# Patient Record
Sex: Female | Born: 1935
Health system: Southern US, Community
[De-identification: ages and names within clinical notes are randomized; demographics above are authoritative.]

## PROBLEM LIST (undated history)

## (undated) DIAGNOSIS — H548 Legal blindness, as defined in USA: Secondary | ICD-10-CM

## (undated) DIAGNOSIS — E039 Hypothyroidism, unspecified: Secondary | ICD-10-CM

## (undated) DIAGNOSIS — I82409 Acute embolism and thrombosis of unspecified deep veins of unspecified lower extremity: Secondary | ICD-10-CM

## (undated) DIAGNOSIS — H409 Unspecified glaucoma: Secondary | ICD-10-CM

## (undated) DIAGNOSIS — I10 Essential (primary) hypertension: Secondary | ICD-10-CM

## (undated) HISTORY — DX: Essential (primary) hypertension: I10

## (undated) HISTORY — PX: SPINE SURGERY: SHX786

## (undated) HISTORY — PX: NASAL SINUS SURGERY: SHX719

## (undated) HISTORY — DX: Unspecified glaucoma: H40.9

---

## 2001-12-31 ENCOUNTER — Ambulatory Visit (HOSPITAL_COMMUNITY): Admission: RE | Admit: 2001-12-31 | Discharge: 2001-12-31 | Payer: Self-pay | Admitting: Internal Medicine

## 2001-12-31 ENCOUNTER — Encounter: Payer: Self-pay | Admitting: Internal Medicine

## 2003-01-02 ENCOUNTER — Encounter: Payer: Self-pay | Admitting: Internal Medicine

## 2003-01-02 ENCOUNTER — Ambulatory Visit (HOSPITAL_COMMUNITY): Admission: RE | Admit: 2003-01-02 | Discharge: 2003-01-02 | Payer: Self-pay | Admitting: Internal Medicine

## 2003-05-18 ENCOUNTER — Ambulatory Visit (HOSPITAL_COMMUNITY): Admission: RE | Admit: 2003-05-18 | Discharge: 2003-05-18 | Payer: Self-pay | Admitting: Internal Medicine

## 2004-01-09 ENCOUNTER — Ambulatory Visit (HOSPITAL_COMMUNITY): Admission: RE | Admit: 2004-01-09 | Discharge: 2004-01-09 | Payer: Self-pay | Admitting: Internal Medicine

## 2005-05-05 ENCOUNTER — Ambulatory Visit (HOSPITAL_COMMUNITY): Admission: RE | Admit: 2005-05-05 | Discharge: 2005-05-05 | Payer: Self-pay | Admitting: Internal Medicine

## 2006-05-11 ENCOUNTER — Ambulatory Visit (HOSPITAL_COMMUNITY): Admission: RE | Admit: 2006-05-11 | Discharge: 2006-05-11 | Payer: Self-pay | Admitting: Internal Medicine

## 2006-06-05 ENCOUNTER — Ambulatory Visit (HOSPITAL_COMMUNITY): Admission: RE | Admit: 2006-06-05 | Discharge: 2006-06-05 | Payer: Self-pay | Admitting: Internal Medicine

## 2007-05-17 ENCOUNTER — Ambulatory Visit (HOSPITAL_COMMUNITY): Admission: RE | Admit: 2007-05-17 | Discharge: 2007-05-17 | Payer: Self-pay | Admitting: Internal Medicine

## 2008-01-25 ENCOUNTER — Ambulatory Visit (HOSPITAL_COMMUNITY): Admission: RE | Admit: 2008-01-25 | Discharge: 2008-01-25 | Payer: Self-pay | Admitting: Internal Medicine

## 2008-02-29 ENCOUNTER — Ambulatory Visit (HOSPITAL_COMMUNITY): Admission: RE | Admit: 2008-02-29 | Discharge: 2008-02-29 | Payer: Self-pay | Admitting: Ophthalmology

## 2008-05-18 ENCOUNTER — Ambulatory Visit (HOSPITAL_COMMUNITY): Admission: RE | Admit: 2008-05-18 | Discharge: 2008-05-18 | Payer: Self-pay | Admitting: Internal Medicine

## 2008-05-19 ENCOUNTER — Ambulatory Visit: Payer: Self-pay | Admitting: Internal Medicine

## 2008-06-08 ENCOUNTER — Ambulatory Visit: Payer: Self-pay | Admitting: Internal Medicine

## 2008-06-08 ENCOUNTER — Ambulatory Visit (HOSPITAL_COMMUNITY): Admission: RE | Admit: 2008-06-08 | Discharge: 2008-06-08 | Payer: Self-pay | Admitting: Internal Medicine

## 2008-06-08 ENCOUNTER — Encounter: Payer: Self-pay | Admitting: Internal Medicine

## 2008-07-18 ENCOUNTER — Ambulatory Visit (HOSPITAL_COMMUNITY): Admission: RE | Admit: 2008-07-18 | Discharge: 2008-07-18 | Payer: Self-pay | Admitting: Ophthalmology

## 2009-05-21 ENCOUNTER — Ambulatory Visit (HOSPITAL_COMMUNITY): Admission: RE | Admit: 2009-05-21 | Discharge: 2009-05-21 | Payer: Self-pay | Admitting: Internal Medicine

## 2010-05-27 ENCOUNTER — Ambulatory Visit (HOSPITAL_COMMUNITY): Admission: RE | Admit: 2010-05-27 | Discharge: 2010-05-27 | Payer: Self-pay | Admitting: Internal Medicine

## 2011-02-18 NOTE — Op Note (Signed)
NAME:  DYLANN, LAYNE               ACCOUNT NO.:  [id number]   MEDICAL RECORD NO.:  [id number]          PATIENT TYPE:  AMB   LOCATION:  DAY                           FACILITY:  [facility]   PHYSICIAN:  R. Roetta Sessions, M.D. DATE OF BIRTH:  [birth year]   DATE OF PROCEDURE:  06/08/2008  DATE OF DISCHARGE:                               OPERATIVE REPORT   Colonoscopy, snare polypectomy, polyp ablation.   INDICATIONS FOR PROCEDURE:  An 77 year old lady with a history of  colonic adenomas, last colonoscopy is 2004, and she has found have only  benign polypoid mucosa.  She has no lower GI tract symptoms.  Colonoscopy is now being done as a surveillance maneuver.  Risks,  benefits, alternatives, limitations have been reviewed, questions  answered.  Please see documentation of medical record.   PROCEDURE NOTE:  O2 saturation, blood pressure, pulse, respirations  monitored throughout the entire procedure.   CONSCIOUS SEDATION:  Versed 4 mg IV, Demerol 75 mg IV in divided doses.   INSTRUMENT:  Pentax video chip system.   FINDINGS:  Digital rectal exam revealed no abnormalities.  Endoscopic  findings, the prep was good.  Colon:  Colonic mucosa was surveyed from  the rectosigmoid junction through the left transverse, right colon,  appendiceal orifice, ileocecal valve, and cecum.  These structures were  well seen and photographed for the record. From this level, scope was  slowly cautiously withdrawn.  All previously mentioned mucosal surfaces  were again seen.  The patient was noted to have a 5-mm polyp in the  base.  The cecum which was cold snared were recovered through the scope.  Adjacent to this area, there were two other areas of adenomatous-  appearing polypoid tissue, one was about 2-mm in dimension, one was  about 1 x 4 mm in dimensions on a fold.  These areas were ablated with  tip of the snare with hot cautery.  Just distal to that the ileocecal  valve by about 10-cm was a 6-mm flat  adenoma on a fold, it was removed  with hot snare cautery, recovered through the scope.  The rectal colonic  mucosa was closely inspected.  The patient has scattered left-sided  diverticula.  The remainder of colonic mucosa appeared normal.  Scope  was pulled down the rectum.  A thorough examination of rectal mucosa  including retroflex view of the anal verge revealed no abnormalities.  The patient tolerated the procedure well as reactive  Endoscopy.   IMPRESSION:  1. Multiple cecal polyps ablated and/or snare as described above.  2. Proximal ascending polyp (10-cm distal ileocecal valve) status post      hot snare polypectomy.  3. Left-sided diverticula.  Colonic mucosa appeared unremarkable,      normal rectum.   RECOMMENDATIONS:  1. A diverticulosis polyp literature provided Ms. Sunday.  2. Hold aspirin for 5 days and resume.  3. Followup on path.  4. Further recommendations to follow.      Jonathon Bellows, M.D.  Electronically Signed     RMR/MEDQ  D:  06/08/2008  T:  06/08/2008  Job:  [id number]   cc:   Kingsley Callander. Ouida Sills, MD  Fax: [phone]

## 2011-02-18 NOTE — H&P (Signed)
NAME:  Nancy [name], Nancy [name]               ACCOUNT NO.:  [id number]   MEDICAL RECORD NO.:  [id number]          PATIENT TYPE:  AMB   LOCATION:  DAY                           FACILITY:  [facility]   PHYSICIAN:  R. Roetta Sessions, M.D. DATE OF BIRTH:  04/09/1925   DATE OF ADMISSION:  DATE OF DISCHARGE:  LH                              HISTORY & PHYSICAL   CHIEF COMPLAINT:  Time for colonoscopy.   HISTORY OF PRESENT ILLNESS:  Nancy [name] is an 77 year old Caucasian  female with a history of adenomatous polyps, who presented today to  schedule surveillance colonoscopy.  She has been doing very well since  her last followup.  She has had some issues with her eyes.  She has had  a cataract extraction on the right side and diagnosed with glaucoma.  She has had chronic intermittent hematochezia when she passes a hard  stool.  She states this is almost every time she has a bowel movement  actually.  Her bowel movements are irregular, currently, anywhere from  daily to every 2-3 days.  She does not use any medication.  She does not  have any bowel regimen per se.  She denies any abdominal pain, nausea or  vomiting, heart burn, dysphagia, odynophagia, or melena.   CURRENT MEDICATIONS:  1. Synthroid 50 mcg daily.  2. Evista 60 mg daily.  3. Lipitor 40 mg daily.  4. Calcium with vitamin D 600 mg daily.  5. Aspirin 81 mg daily.  6. Theratears daily.  7. Restasis eye drops b.i.d.  8. PreserVision 2 daily.  9. Travatan eye drops daily.  10.Systane eye drops daily.   ALLERGIES:  No known drug allergies.   PAST MEDICAL HISTORY:  1. Hyperlipidemia.  2. Hypothyroidism.  3. Glaucoma.  4. She had 2 back surgeries for ruptured disk.  5. Right cataract extraction recently.   FAMILY HISTORY:  Mother died of breast cancer at age 12.  Father died of  myocardial infarction at age 51.  She had 3 sisters with breast cancer,  one has deceased.  No family history of colorectal cancer.   SOCIAL HISTORY:  She is  widowed.  She has 4 children.  She is retired  from [employer].  She has never been a smoker.  No alcohol use.   REVIEW OF SYSTEMS:  GI:  See HPI.  CONSTITUTIONAL:  No intentional  weight loss.  CARDIOPULMONARY:  No chest pain or shortness of breath.  GENITOURINARY:  No dysuria or hematuria.   PHYSICAL EXAMINATION:  VITAL SIGNS:  Weight 145, height 5 feet 7 inches,  temperature 98.5, blood pressure 128/80, pulse 84.  GENERAL:  A pleasant, well-nourished, well-developed Caucasian female,  in no acute distress.  SKIN:  Warm and dry.  No jaundice.  HEENT:  Sclera nonicteric.  Oropharyngeal mucosa moist and pink.  No  lesions, erythema, or exudate.  CHEST:  Lungs are clear to auscultation.  CARDIAC:  Regular rate and rhythm.  No murmurs, rubs, or gallops.  ABDOMEN:  Positive bowel sounds.  Abdomen is soft, nontender, and  nondistended.  No organomegaly or masses.  No rebound or guarding.  No  abdominal bruits or hernia.  LOWER EXTREMITIES:  No edema.   IMPRESSION:  The patient is a pleasant 77 year old lady with history of  adenomatous colonic polyps who is due for surveillance colonoscopy.  She  has chronic intermittent hematochezia most likely due to benign  anorectal source.   PLAN:  1. Colonoscopy with Dr. Jena Gauss in the near future.  2. Home aspirin 4 days prior to procedure.  3. Recommended MiraLax 17 g daily as needed for constipation.      Tana Coast, P.AJonathon Bellows, M.D.  Electronically Signed    LL/MEDQ  D:  05/19/2008  T:  05/20/2008  Job:  [id number]

## 2011-02-21 NOTE — H&P (Signed)
NAME:  Nancy [name], Nancy [name]                           ACCOUNT NO.:  [id number]   MEDICAL RECORD NO.:  [id number]                  PATIENT TYPE:   LOCATION:                                       FACILITY:   PHYSICIAN:  R. Roetta Sessions, M.D.              DATE OF BIRTH:  [birth year]   DATE OF ADMISSION:  DATE OF DISCHARGE:                                HISTORY & PHYSICAL   REASON FOR VISIT:  Problems swallowing, time for colonoscopy.   HISTORY OF PRESENT ILLNESS:  The patient is a 77 year old Caucasian female,  a patient of Dr. Ouida Sills, who presents today for further evaluation of the  above stated symptoms.  She has a history of adenomatous polyps.  Her last  colonoscopy July 2001 at which time she had a left-sided diverticula and a  small, sessile polyp at the appendiceal orifices, which was a tubular  adenoma.  She, over the last several weeks, has developed acid reflux-type  symptoms.  She notices it particularly when she is supine.  She feels like  there is pressure in her esophagus.  She has had excessive belching and some  epigastric/right upper quadrant abdominal pain which occurs intermittently.  She also has the sensation of difficulty swallowing, particularly solid  foods.  She saw Dr. Ouida Sills who placed on Aciphex about a week ago.  She also  decreased her orange juice consumption.  This has helped some but she has  not noted complete resolution of her symptoms.  She has a history of chronic  constipation.  No melena or rectal bleeding.   CURRENT MEDICATIONS:  1. Synthroid 50 mcg every day.  2. Aciphex 20 mg every day.  3. Evista 60 mg every day.  4. Lipitor 10 mg every day.  5. Calcium plus D 600 mg every day.  6. Vitamin E 400 international units every day.  7. Multivitamin every day.  8. Aspirin 81 mg every day.   ALLERGIES:  No known drug allergies.   PAST MEDICAL HISTORY:  1. Hypercholesterolemia.  2. Hypothyroidism.  3. She has had two back surgeries for ruptured  disk.   FAMILY HISTORY:  Mother died of breast cancer at age 92.  Father died of MI  at age 72.  She has had three sisters with breast cancer, one is deceased.  No family history of colorectal cancer to her knowledge.   SOCIAL HISTORY:  She is married for 52 years.  She has four children.  She  is retired from [employer].  She has never been a smoker.  Denies  any alcohol use.   REVIEW OF SYSTEMS:  Please see HPI for GI.  GENERAL:  Denies any weight  loss.  CARDIOPULMONARY:  Denies any chest pain or shortness of breath.   PHYSICAL EXAMINATION:  VITAL SIGNS: Weight 149, blood pressure 124/74, pulse  76.  GENERAL:  A pleasant, well nourished, well developed Caucasian  female in no  acute distress.  SKIN:  Warm and dry.  No jaundice.  HEENT:  Conjunctivae are pink.  Sclerae are nonicteric.  Oropharyngeal  mucosa is moist and pink.  No lesions, erythema or exudate.  No  lymphadenopathy, thyromegaly.  CHEST:  Lungs are clear to auscultation.  CARDIAC:  Reveals a regular, rate and rhythm, normal S1, S2.  No murmurs,  rubs or gallops.  ABDOMEN:  Positive bowel sounds.  Soft, nontender, nondistended.  No  organomegaly or masses.  EXTREMITIES:  No edema.   IMPRESSION:  1. The patient is a pleasant 77 year old lady with a history of adenomatous     colonic polyps who was due for surveillance colonoscopy at this time.  2. In addition, she has developed a several week history of reflux type     symptoms as well as difficulty swallowing solid food.  3. Patient's has had intermittent epigastric/right upper quadrant abdominal     pain which is improving on Aciphex.   PLAN:  1. Colonoscopy with EGD and possible esophageal dilatation in the near     future.  2. I discussed risks, alternatives, and benefits of these procedures with     the patient and she is agreeable to proceed.  3. She will continue Aciphex 20 mg daily for now.   I would like to thank Dr. Ouida Sills for allowing [place] to  take part in the care of  this patient.     Tana Coast, Pricilla Larsson, M.D.    LL/MEDQ  D:  05/02/2003  T:  05/02/2003  Job:  [id number]   cc:   Kingsley Callander. Ouida Sills, M.D.  [address]  [place]  [place] [address]  Fax: [phone]   R. Roetta Sessions, M.D.  [address]  [place]  [place] [address]  Fax: [phone]

## 2011-02-21 NOTE — Op Note (Signed)
NAME:  Nancy Nancy [name], Nancy Nancy [name]                         ACCOUNT NO.:  [id number]   MEDICAL RECORD NO.:  [id number]                   PATIENT TYPE:  AMB   LOCATION:  DAY                                  FACILITY:  [facility]   PHYSICIAN:  R. Roetta Sessions, M.D.              DATE OF BIRTH:  Sep 07, 1925   DATE OF PROCEDURE:  05/18/2003  DATE OF DISCHARGE:                                 OPERATIVE REPORT   PROCEDURES:  Esophagogastroduodenoscopy and colonoscopy with biopsy.   INDICATION FOR PROCEDURE:  The patient is Nancy [name] 77 year old lady with Nancy [name] history  of colonic adenomatous polyps.  She is due for surveillance at this time.  Also she has had Nancy [name] several-week history of reflux-type symptoms with some  difficulty swallowing.  It is notable she was started on Aciphex by Dr.  Ouida Sills recently.  This has been associated with Nancy [name] marked improvement in her  reflux symptoms and resolution of her dysphagia symptoms.  EGD and  colonoscopy are now being done.  This approach has been discussed with the  patient, the potential risks, benefits, and alternatives have been reviewed,  questions answered, she is agreeable.  Please see my dictated H&P from May 02, 2003, for more information.   PROCEDURE NOTE:  O2 saturation, blood pressure, pulse, and respirations were  monitored throughout the entire procedure.   Conscious sedation:  Versed 3 mg IV, Demerol 75 mg IV, in divided doses.   Instrument:  Olympus video chip adult gastroscope and colonoscope.   FINDINGS:  Esophagogastroduodenoscopy:  Examination of the tubular esophagus revealed  no mucosal abnormalities.  It was widely patent, the EG junction easily  traversed.   Stomach:  The gastric cavity was empty, insufflated well with air.  Nancy [name]  thorough examination of the gastric mucosa, including retroflexed view of  the proximal stomach and esophagogastric junction, demonstrated Nancy [name] small  hiatal hernia.  The remainder of the gastric mucosa appeared normal.  Pylorus  patent and easily traversed.   Duodenum:  The bulb and second portion appeared normal.   Therapeutic/diagnostic maneuvers performed:  None.   The patient tolerated the procedure well, was prepared for colonoscopy.  Digital rectal examination revealed no endoscopy.   Endoscopic findings:  Prep was good.   Rectum:  Examination of the rectal mucosa including retroflexed view of the  anal verge revealed no abnormalities.   Colon:  The colonic mucosa was surveyed from the rectosigmoid junction  through the left, transverse, and right colon, to the area of the  appendiceal orifice, ileocecal valve, and cecum.  These structures were well-  seen and photographed for the record.  The patient had Nancy [name] tortuous left colon  with numerous left-sided diverticula.  There were two 3 mm polyps in the  cecum, which were cold biopsied/removed.  The remainder of the colonic  mucosa appeared normal.  From this level the scope was withdrawn.  All  previously-mentioned mucosal  surfaces were again seen and, again, no other  abnormalities were observed.  The patient tolerated both procedures well,  was reactive in endoscopy.   IMPRESSION:  EGD:  1. Normal esophagus.  2. Small hiatal hernia.  3. Remainder of the stomach and duodenum through the second portion appeared     normal;   Colonoscopy findings:  1. Normal rectum.  2. Tortuous left colon with numerous left-sided diverticula.  3. Diminutive polyps in the cecum cold biopsied/removed.  4. Remainder of the colonic mucosa appeared normal.   RECOMMENDATIONS:  1. Diverticulosis literature provided to Nancy Nancy [name].  2. Continue Aciphex 20 mg orally daily (which seems to be helping     tremendously).  3. Follow up on pathology.  4. Further recommendations to follow.                                                 Jonathon Bellows, M.D.    RMR/MEDQ  D:  05/18/2003  T:  05/18/2003  Job:  [id number]   cc:   Kingsley Callander. Ouida Sills, M.D.  [address]  [place]  [place] [address]  Fax: [phone]

## 2011-04-23 ENCOUNTER — Other Ambulatory Visit (HOSPITAL_COMMUNITY): Payer: Self-pay | Admitting: Internal Medicine

## 2011-04-23 DIAGNOSIS — Z139 Encounter for screening, unspecified: Secondary | ICD-10-CM

## 2011-06-02 ENCOUNTER — Ambulatory Visit (HOSPITAL_COMMUNITY)
Admission: RE | Admit: 2011-06-02 | Discharge: 2011-06-02 | Disposition: A | Discharge status: Home / Self Care | Payer: Medicare Other | Source: Ambulatory Visit | Attending: Internal Medicine | Admitting: Internal Medicine

## 2011-06-02 DIAGNOSIS — Z139 Encounter for screening, unspecified: Secondary | ICD-10-CM

## 2011-06-02 DIAGNOSIS — Z1231 Encounter for screening mammogram for malignant neoplasm of breast: Secondary | ICD-10-CM | POA: Insufficient documentation

## 2011-07-02 LAB — HEMOGLOBIN AND HEMATOCRIT, BLOOD
HCT: 39.8
Hemoglobin: 14.3

## 2011-07-02 LAB — BASIC METABOLIC PANEL
Chloride: 103
GFR calc Af Amer: >60
Potassium: 3.8

## 2011-07-07 DIAGNOSIS — H401133 Primary open-angle glaucoma, bilateral, severe stage: Secondary | ICD-10-CM | POA: Insufficient documentation

## 2011-09-16 DIAGNOSIS — H348192 Central retinal vein occlusion, unspecified eye, stable: Secondary | ICD-10-CM | POA: Insufficient documentation

## 2011-11-17 ENCOUNTER — Other Ambulatory Visit (HOSPITAL_COMMUNITY): Payer: Self-pay | Admitting: Internal Medicine

## 2011-11-17 ENCOUNTER — Ambulatory Visit (HOSPITAL_COMMUNITY)
Admission: RE | Admit: 2011-11-17 | Discharge: 2011-11-17 | Disposition: A | Discharge status: Home / Self Care | Payer: Medicare Other | Source: Ambulatory Visit | Attending: Internal Medicine | Admitting: Internal Medicine

## 2011-11-17 DIAGNOSIS — M25519 Pain in unspecified shoulder: Secondary | ICD-10-CM | POA: Insufficient documentation

## 2011-11-17 DIAGNOSIS — M25512 Pain in left shoulder: Secondary | ICD-10-CM

## 2012-04-20 ENCOUNTER — Other Ambulatory Visit: Payer: Self-pay | Admitting: Otolaryngology

## 2012-06-22 ENCOUNTER — Other Ambulatory Visit (HOSPITAL_COMMUNITY): Payer: Self-pay | Admitting: Internal Medicine

## 2012-06-22 DIAGNOSIS — Z139 Encounter for screening, unspecified: Secondary | ICD-10-CM

## 2012-06-28 ENCOUNTER — Ambulatory Visit (HOSPITAL_COMMUNITY): Payer: Medicare Other

## 2012-07-05 ENCOUNTER — Ambulatory Visit (HOSPITAL_COMMUNITY)
Admission: RE | Admit: 2012-07-05 | Discharge: 2012-07-05 | Disposition: A | Discharge status: Home / Self Care | Payer: Medicare Other | Source: Ambulatory Visit | Attending: Internal Medicine | Admitting: Internal Medicine

## 2012-07-05 DIAGNOSIS — Z139 Encounter for screening, unspecified: Secondary | ICD-10-CM

## 2012-07-05 DIAGNOSIS — Z1231 Encounter for screening mammogram for malignant neoplasm of breast: Secondary | ICD-10-CM | POA: Insufficient documentation

## 2013-05-16 ENCOUNTER — Encounter: Payer: Self-pay | Admitting: Internal Medicine

## 2013-06-09 ENCOUNTER — Other Ambulatory Visit (HOSPITAL_COMMUNITY): Payer: Self-pay | Admitting: Internal Medicine

## 2013-06-09 DIAGNOSIS — Z139 Encounter for screening, unspecified: Secondary | ICD-10-CM

## 2013-07-07 ENCOUNTER — Ambulatory Visit (HOSPITAL_COMMUNITY)
Admission: RE | Admit: 2013-07-07 | Discharge: 2013-07-07 | Disposition: A | Discharge status: Home / Self Care | Payer: Medicare Other | Source: Ambulatory Visit | Attending: Internal Medicine | Admitting: Internal Medicine

## 2013-07-07 DIAGNOSIS — Z803 Family history of malignant neoplasm of breast: Secondary | ICD-10-CM | POA: Insufficient documentation

## 2013-07-07 DIAGNOSIS — Z139 Encounter for screening, unspecified: Secondary | ICD-10-CM

## 2013-07-07 DIAGNOSIS — Z1231 Encounter for screening mammogram for malignant neoplasm of breast: Secondary | ICD-10-CM | POA: Insufficient documentation

## 2013-07-29 ENCOUNTER — Other Ambulatory Visit: Payer: Self-pay | Admitting: Otolaryngology

## 2014-05-26 DIAGNOSIS — J339 Nasal polyp, unspecified: Secondary | ICD-10-CM | POA: Insufficient documentation

## 2014-05-26 DIAGNOSIS — J324 Chronic pansinusitis: Secondary | ICD-10-CM | POA: Insufficient documentation

## 2014-07-04 ENCOUNTER — Other Ambulatory Visit (HOSPITAL_COMMUNITY): Payer: Self-pay | Admitting: Internal Medicine

## 2014-07-04 DIAGNOSIS — Z1231 Encounter for screening mammogram for malignant neoplasm of breast: Secondary | ICD-10-CM

## 2014-07-14 ENCOUNTER — Ambulatory Visit (HOSPITAL_COMMUNITY)
Admission: RE | Admit: 2014-07-14 | Discharge: 2014-07-14 | Disposition: A | Discharge status: Home / Self Care | Payer: Medicare Other | Source: Ambulatory Visit | Attending: Internal Medicine | Admitting: Internal Medicine

## 2014-07-14 DIAGNOSIS — Z1231 Encounter for screening mammogram for malignant neoplasm of breast: Secondary | ICD-10-CM | POA: Insufficient documentation

## 2014-12-22 DIAGNOSIS — Z961 Presence of intraocular lens: Secondary | ICD-10-CM | POA: Insufficient documentation

## 2015-04-12 ENCOUNTER — Ambulatory Visit (INDEPENDENT_AMBULATORY_CARE_PROVIDER_SITE_OTHER): Payer: Medicare Other | Admitting: Podiatry

## 2015-04-12 ENCOUNTER — Encounter: Payer: Self-pay | Admitting: Podiatry

## 2015-04-12 VITALS — BP 166/99 | HR 87 | Resp 18

## 2015-04-12 DIAGNOSIS — L600 Ingrowing nail: Secondary | ICD-10-CM

## 2015-04-12 DIAGNOSIS — L03031 Cellulitis of right toe: Secondary | ICD-10-CM

## 2015-04-12 NOTE — Progress Notes (Signed)
   Subjective:    Patient ID: Nancy [name], female    DOB: 1925-04-19, 81 y.o.   MRN: [id number]  HPI MY RIGHT BIG TOENAIL HAS BEEN CURLING IN AND HAS BEEN GOING ON FOR ABOUT 3 WEEKS AND IS RED AND SORE AND HAS BEEN INFECTED AND HAD A SCAB OVER IT AND DOES BURN This patients with painful right big toe.  She says the toe has previously been more painful and but has improved.  She has marked incurvation inside border right big toe.  No drainage last few days.  She has self treated with soaks. Today there is redness at tip of inside border right toe.   Review of Systems  All other systems reviewed and are negative.      Objective:   Physical Exam Objective: Review of past medical history, medications, social history and allergies were performed.  Vascular: Dorsalis pedis and posterior tibial pulses were palpable B/L, capillary refill was  WNL B/L, temperature gradient was WNL B/L   Skin:  No signs of symptoms of infection or ulcers on both feet  Nails:  Redness and swelling at tip of medial aspect right hallux.  No pus present.  No drainage present.  Palpable pain at distal aspect medial border right hallux. Desquamation at medial aspect of toe.  Sensory: Phoebe PerchSemmes Weinstein monifilament WNL   Orthopedic: Orthopedic evaluation demonstrates all joints distal t ankle have full ROM without crepitus, muscle power WNL B/L        Assessment & Plan:  Paronychia  Right hallux Ingrown nail right hallux.  IE>  I & D medial aspect right hallux.  Neosporin/DSD.

## 2015-04-19 ENCOUNTER — Ambulatory Visit: Payer: Medicare Other | Admitting: Podiatry

## 2015-05-01 ENCOUNTER — Ambulatory Visit (INDEPENDENT_AMBULATORY_CARE_PROVIDER_SITE_OTHER): Payer: Medicare Other | Admitting: Podiatry

## 2015-05-01 ENCOUNTER — Encounter: Payer: Self-pay | Admitting: Podiatry

## 2015-05-01 DIAGNOSIS — Z09 Encounter for follow-up examination after completed treatment for conditions other than malignant neoplasm: Secondary | ICD-10-CM

## 2015-05-01 NOTE — Progress Notes (Signed)
Subjective:     Patient ID: Nancy [name], female   DOB: [birth year], 81 y.o.   MRN: [id number]  HPIThis patient presents to the office three weeks following removal of nail spicule inside border right hallux.  The is marked improvement of infection but nail still causes numbness.   Review of Systems     Objective:   Physical Exam GENERAL APPEARANCE: Alert, conversant. Appropriately groomed. No acute distress.  VASCULAR: Pedal pulses palpable at 2/4 DP and PT bilateral.  Capillary refill time is immediate to all digits,  Proximal to distal cooling it warm to warm.  Digital hair growth is present bilateral  NEUROLOGIC: sensation is intact epicritically and protectively to 5.07 monofilament at 5/5 sites bilateral.  Light touch is intact bilateral, vibratory sensation intact bilateral, achilles tendon reflex is intact bilateral.  MUSCULOSKELETAL: acceptable muscle strength, tone and stability bilateral.  Intrinsic muscluature intact bilateral.  Rectus appearance of foot and digits noted bilateral.   DERMATOLOGIC: skin color, texture, and turgor are within normal limits.  No preulcerative lesions or ulcers  are seen, no interdigital maceration noted.  No open lesions present.  Digital nails are asymptomatic. No drainage noted. Nails  There is no evidence of redness or swelling at distal aspect medial border right hallux.     Assessment:     S/p Ingrowing Toenail     Plan:     ROV  Return to shoes and D/C soaks

## 2015-05-22 ENCOUNTER — Other Ambulatory Visit (HOSPITAL_COMMUNITY): Payer: Self-pay | Admitting: Internal Medicine

## 2015-05-22 ENCOUNTER — Ambulatory Visit (HOSPITAL_COMMUNITY)
Admission: RE | Admit: 2015-05-22 | Discharge: 2015-05-22 | Disposition: A | Discharge status: Home / Self Care | Payer: Medicare Other | Source: Ambulatory Visit | Attending: Internal Medicine | Admitting: Internal Medicine

## 2015-05-22 DIAGNOSIS — M25552 Pain in left hip: Secondary | ICD-10-CM | POA: Insufficient documentation

## 2015-06-27 ENCOUNTER — Other Ambulatory Visit (HOSPITAL_COMMUNITY): Payer: Self-pay | Admitting: Internal Medicine

## 2015-06-27 DIAGNOSIS — Z1231 Encounter for screening mammogram for malignant neoplasm of breast: Secondary | ICD-10-CM

## 2015-07-03 ENCOUNTER — Ambulatory Visit (INDEPENDENT_AMBULATORY_CARE_PROVIDER_SITE_OTHER): Payer: Medicare Other | Admitting: Podiatry

## 2015-07-03 ENCOUNTER — Encounter: Payer: Self-pay | Admitting: Podiatry

## 2015-07-03 DIAGNOSIS — M79673 Pain in unspecified foot: Secondary | ICD-10-CM

## 2015-07-03 DIAGNOSIS — M79609 Pain in unspecified limb: Principal | ICD-10-CM

## 2015-07-03 DIAGNOSIS — B351 Tinea unguium: Secondary | ICD-10-CM

## 2015-07-03 NOTE — Progress Notes (Signed)
Patient ID: Nancy [name], female   DOB: 1924-10-11, 81 y.o.   MRN: [id number] Complaint:  Visit Type: Patient returns to my office for continued preventative foot care services. Complaint: Patient states" my nails have grown long and thick and become painful to walk and wear shoes" . The patient presents for preventative foot care services. No changes to ROS.  Past history of nail surgery medial border right foot.  Podiatric Exam: Vascular: dorsalis pedis and posterior tibial pulses are palpable bilateral. Capillary return is immediate. Temperature gradient is WNL. Skin turgor WNL  Sensorium: Normal Semmes Weinstein monofilament test. Normal tactile sensation bilaterally. Nail Exam: Pt has thick disfigured discolored nails with subungual debris noted bilateral entire nail hallux  Ulcer Exam: There is no evidence of ulcer or pre-ulcerative changes or infection. Orthopedic Exam: Muscle tone and strength are WNL. No limitations in general ROM. No crepitus or effusions noted. Foot type and digits show no abnormalities. Bony prominences are unremarkable. Skin: No Porokeratosis. No infection or ulcers  Diagnosis:  Onychomycosis, , Pain in right toe, pain in left toes  Treatment & Plan Procedures and Treatment: Consent by patient was obtained for treatment procedures. The patient understood the discussion of treatment and procedures well. All questions were answered thoroughly reviewed. Debridement of mycotic and hypertrophic toenails, 1 through 5 bilateral and clearing of subungual debris. No ulceration, no infection noted.  Return Visit-Office Procedure: Patient instructed to return to the office for a follow up visit 3 months for continued evaluation and treatment.

## 2015-07-20 ENCOUNTER — Ambulatory Visit (HOSPITAL_COMMUNITY)
Admission: RE | Admit: 2015-07-20 | Discharge: 2015-07-20 | Disposition: A | Discharge status: Home / Self Care | Payer: Medicare Other | Source: Ambulatory Visit | Attending: Internal Medicine | Admitting: Internal Medicine

## 2015-07-20 DIAGNOSIS — Z1231 Encounter for screening mammogram for malignant neoplasm of breast: Secondary | ICD-10-CM | POA: Insufficient documentation

## 2015-07-23 ENCOUNTER — Other Ambulatory Visit (HOSPITAL_COMMUNITY): Payer: Self-pay | Admitting: Internal Medicine

## 2015-07-23 DIAGNOSIS — I824Y2 Acute embolism and thrombosis of unspecified deep veins of left proximal lower extremity: Secondary | ICD-10-CM

## 2015-07-24 ENCOUNTER — Ambulatory Visit (HOSPITAL_COMMUNITY)
Admission: RE | Admit: 2015-07-24 | Discharge: 2015-07-24 | Disposition: A | Discharge status: Home / Self Care | Payer: Medicare Other | Source: Ambulatory Visit | Attending: Internal Medicine | Admitting: Internal Medicine

## 2015-07-24 DIAGNOSIS — I82432 Acute embolism and thrombosis of left popliteal vein: Secondary | ICD-10-CM | POA: Insufficient documentation

## 2015-07-24 DIAGNOSIS — I82412 Acute embolism and thrombosis of left femoral vein: Secondary | ICD-10-CM | POA: Insufficient documentation

## 2015-07-24 DIAGNOSIS — I824Y2 Acute embolism and thrombosis of unspecified deep veins of left proximal lower extremity: Secondary | ICD-10-CM

## 2015-07-24 DIAGNOSIS — I82402 Acute embolism and thrombosis of unspecified deep veins of left lower extremity: Secondary | ICD-10-CM | POA: Diagnosis present

## 2015-10-02 ENCOUNTER — Ambulatory Visit: Payer: Medicare Other | Admitting: Podiatry

## 2015-10-10 ENCOUNTER — Encounter: Payer: Self-pay | Admitting: Podiatry

## 2015-10-10 ENCOUNTER — Ambulatory Visit (INDEPENDENT_AMBULATORY_CARE_PROVIDER_SITE_OTHER): Payer: Medicare Other | Admitting: Podiatry

## 2015-10-10 DIAGNOSIS — L600 Ingrowing nail: Secondary | ICD-10-CM

## 2015-10-10 DIAGNOSIS — B351 Tinea unguium: Secondary | ICD-10-CM | POA: Diagnosis not present

## 2015-10-10 DIAGNOSIS — M79676 Pain in unspecified toe(s): Secondary | ICD-10-CM

## 2015-10-10 DIAGNOSIS — M79609 Pain in unspecified limb: Principal | ICD-10-CM

## 2015-10-10 NOTE — Progress Notes (Signed)
Patient ID: Nancy [name], female   DOB: 08/28/1925, 82 y.o.   MRN: [id number] Complaint:  Visit Type: Patient returns to my office for continued preventative foot care services. Complaint: Patient states" my nails have grown long and thick and become painful to walk and wear shoes" . The patient presents for preventative foot care services. No changes to ROS.  Past history of nail surgery medial border right foot.  Podiatric Exam: Vascular: dorsalis pedis and posterior tibial pulses are palpable bilateral. Capillary return is immediate. Temperature gradient is WNL. Skin turgor WNL  Sensorium: Normal Semmes Weinstein monofilament test. Normal tactile sensation bilaterally. Nail Exam: Pt has thick disfigured discolored nails with subungual debris noted bilateral entire nail hallux  Ulcer Exam: There is no evidence of ulcer or pre-ulcerative changes or infection. Orthopedic Exam: Muscle tone and strength are WNL. No limitations in general ROM. No crepitus or effusions noted. Foot type and digits show no abnormalities. Bony prominences are unremarkable. Skin: No Porokeratosis. No infection or ulcers  Diagnosis:  Onychomycosis, , Pain in right toe, pain in left toes  Treatment & Plan Procedures and Treatment: Consent by patient was obtained for treatment procedures. The patient understood the discussion of treatment and procedures well. All questions were answered thoroughly reviewed. Debridement of mycotic and hypertrophic toenails, 1 through 5 bilateral and clearing of subungual debris. No ulceration, no infection noted.  Return Visit-Office Procedure: Patient instructed to return to the office for a follow up visit 3 months for continued evaluation and treatment....   Ersel Wadleigh DPM 

## 2016-01-09 ENCOUNTER — Ambulatory Visit (INDEPENDENT_AMBULATORY_CARE_PROVIDER_SITE_OTHER): Payer: Medicare Other | Admitting: Podiatry

## 2016-01-09 ENCOUNTER — Encounter: Payer: Self-pay | Admitting: Podiatry

## 2016-01-09 DIAGNOSIS — B351 Tinea unguium: Secondary | ICD-10-CM | POA: Diagnosis not present

## 2016-01-09 DIAGNOSIS — M79609 Pain in unspecified limb: Principal | ICD-10-CM

## 2016-01-09 DIAGNOSIS — M79673 Pain in unspecified foot: Secondary | ICD-10-CM | POA: Diagnosis not present

## 2016-01-09 NOTE — Progress Notes (Signed)
Patient ID: Nancy [name], female   DOB: 10/30/1924, 82 y.o.   MRN: [id number] Complaint:  Visit Type: Patient returns to my office for continued preventative foot care services. Complaint: Patient states" my nails have grown long and thick and become painful to walk and wear shoes" . The patient presents for preventative foot care services. No changes to ROS.  Past history of nail surgery medial border right foot.  Podiatric Exam: Vascular: dorsalis pedis and posterior tibial pulses are palpable bilateral. Capillary return is immediate. Temperature gradient is WNL. Skin turgor WNL  Sensorium: Normal Semmes Weinstein monofilament test. Normal tactile sensation bilaterally. Nail Exam: Pt has thick disfigured discolored nails with subungual debris noted bilateral entire nail hallux  Ulcer Exam: There is no evidence of ulcer or pre-ulcerative changes or infection. Orthopedic Exam: Muscle tone and strength are WNL. No limitations in general ROM. No crepitus or effusions noted. Foot type and digits show no abnormalities. Bony prominences are unremarkable. Skin: No Porokeratosis. No infection or ulcers  Diagnosis:  Onychomycosis, , Pain in right toe, pain in left toes  Treatment & Plan Procedures and Treatment: Consent by patient was obtained for treatment procedures. The patient understood the discussion of treatment and procedures well. All questions were answered thoroughly reviewed. Debridement of mycotic and hypertrophic toenails, 1 through 5 bilateral and clearing of subungual debris. No ulceration, no infection noted.  Return Visit-Office Procedure: Patient instructed to return to the office for a follow up visit 3 months for continued evaluation and treatment....   Deagen Krass DPM 

## 2016-04-03 ENCOUNTER — Ambulatory Visit (INDEPENDENT_AMBULATORY_CARE_PROVIDER_SITE_OTHER): Payer: Medicare Other | Admitting: Podiatry

## 2016-04-03 ENCOUNTER — Encounter: Payer: Self-pay | Admitting: Podiatry

## 2016-04-03 DIAGNOSIS — M79674 Pain in right toe(s): Secondary | ICD-10-CM

## 2016-04-03 DIAGNOSIS — M79609 Pain in unspecified limb: Principal | ICD-10-CM

## 2016-04-03 DIAGNOSIS — B351 Tinea unguium: Secondary | ICD-10-CM | POA: Diagnosis not present

## 2016-04-03 DIAGNOSIS — L600 Ingrowing nail: Secondary | ICD-10-CM

## 2016-04-03 NOTE — Progress Notes (Signed)
Patient ID: Nancy [name], female   DOB: 1925/01/07, 82 y.o.   MRN: [id number] Complaint:  Visit Type: Patient returns to my office for continued preventative foot care services. Complaint: Patient states" my nails have grown long and thick and become painful to walk and wear shoes" . The patient presents for preventative foot care services. No changes to ROS.  Past history of nail surgery medial border right foot.  Podiatric Exam: Vascular: dorsalis pedis and posterior tibial pulses are palpable bilateral. Capillary return is immediate. Temperature gradient is WNL. Skin turgor WNL  Sensorium: Normal Semmes Weinstein monofilament test. Normal tactile sensation bilaterally. Nail Exam: Pt has thick disfigured discolored nails with subungual debris noted bilateral entire nail hallux  Ulcer Exam: There is no evidence of ulcer or pre-ulcerative changes or infection. Orthopedic Exam: Muscle tone and strength are WNL. No limitations in general ROM. No crepitus or effusions noted. Foot type and digits show no abnormalities. Bony prominences are unremarkable. Skin: No Porokeratosis. No infection or ulcers  Diagnosis:  Onychomycosis, , Pain in right toe, pain in left toes  Treatment & Plan Procedures and Treatment: Consent by patient was obtained for treatment procedures. The patient understood the discussion of treatment and procedures well. All questions were answered thoroughly reviewed. Debridement of mycotic and hypertrophic toenails, 1 through 5 bilateral and clearing of subungual debris. No ulceration, no infection noted.  Return Visit-Office Procedure: Patient instructed to return to the office for a follow up visit 3 months for continued evaluation and treatment....   Helane GuntherGregory Rohit Deloria DPM

## 2016-04-30 ENCOUNTER — Inpatient Hospital Stay (HOSPITAL_COMMUNITY)
Admission: EM | Admit: 2016-04-30 | Discharge: 2016-05-02 | DRG: 065 | Disposition: A | Discharge status: Home Health | Payer: Medicare Other | Attending: Internal Medicine | Admitting: Internal Medicine

## 2016-04-30 ENCOUNTER — Emergency Department (HOSPITAL_COMMUNITY): Payer: Medicare Other

## 2016-04-30 ENCOUNTER — Inpatient Hospital Stay (HOSPITAL_COMMUNITY): Payer: Medicare Other

## 2016-04-30 ENCOUNTER — Encounter (HOSPITAL_COMMUNITY): Payer: Self-pay | Admitting: Emergency Medicine

## 2016-04-30 DIAGNOSIS — Z86718 Personal history of other venous thrombosis and embolism: Secondary | ICD-10-CM | POA: Diagnosis not present

## 2016-04-30 DIAGNOSIS — I6389 Other cerebral infarction: Secondary | ICD-10-CM

## 2016-04-30 DIAGNOSIS — I10 Essential (primary) hypertension: Secondary | ICD-10-CM | POA: Diagnosis present

## 2016-04-30 DIAGNOSIS — H548 Legal blindness, as defined in USA: Secondary | ICD-10-CM | POA: Diagnosis present

## 2016-04-30 DIAGNOSIS — H409 Unspecified glaucoma: Secondary | ICD-10-CM | POA: Diagnosis present

## 2016-04-30 DIAGNOSIS — L8990 Pressure ulcer of unspecified site, unspecified stage: Secondary | ICD-10-CM | POA: Insufficient documentation

## 2016-04-30 DIAGNOSIS — I6340 Cerebral infarction due to embolism of unspecified cerebral artery: Principal | ICD-10-CM

## 2016-04-30 DIAGNOSIS — G8191 Hemiplegia, unspecified affecting right dominant side: Secondary | ICD-10-CM | POA: Diagnosis present

## 2016-04-30 DIAGNOSIS — L89159 Pressure ulcer of sacral region, unspecified stage: Secondary | ICD-10-CM | POA: Diagnosis present

## 2016-04-30 DIAGNOSIS — E785 Hyperlipidemia, unspecified: Secondary | ICD-10-CM | POA: Diagnosis not present

## 2016-04-30 DIAGNOSIS — Z8249 Family history of ischemic heart disease and other diseases of the circulatory system: Secondary | ICD-10-CM

## 2016-04-30 DIAGNOSIS — R531 Weakness: Secondary | ICD-10-CM | POA: Diagnosis present

## 2016-04-30 DIAGNOSIS — M6289 Other specified disorders of muscle: Secondary | ICD-10-CM

## 2016-04-30 DIAGNOSIS — R739 Hyperglycemia, unspecified: Secondary | ICD-10-CM | POA: Diagnosis present

## 2016-04-30 DIAGNOSIS — E039 Hypothyroidism, unspecified: Secondary | ICD-10-CM | POA: Diagnosis present

## 2016-04-30 DIAGNOSIS — Z803 Family history of malignant neoplasm of breast: Secondary | ICD-10-CM | POA: Diagnosis not present

## 2016-04-30 DIAGNOSIS — I639 Cerebral infarction, unspecified: Secondary | ICD-10-CM

## 2016-04-30 DIAGNOSIS — I6789 Other cerebrovascular disease: Secondary | ICD-10-CM | POA: Diagnosis not present

## 2016-04-30 HISTORY — DX: Hypothyroidism, unspecified: E03.9

## 2016-04-30 HISTORY — DX: Acute embolism and thrombosis of unspecified deep veins of unspecified lower extremity: I82.409

## 2016-04-30 HISTORY — DX: Legal blindness, as defined in USA: H54.8

## 2016-04-30 LAB — COMPREHENSIVE METABOLIC PANEL
ALT: 14 U/L (ref 14–54)
AST: 21 U/L (ref 15–41)
Albumin: 3.6 g/dL (ref 3.5–5.0)
Alkaline Phosphatase: 84 U/L (ref 38–126)
Anion gap: 6 (ref 5–15)
BUN: 15 mg/dL (ref 6–20)
CALCIUM: 8.8 mg/dL — AB (ref 8.9–10.3)
CHLORIDE: 108 mmol/L (ref 101–111)
CO2: 27 mmol/L (ref 22–32)
CREATININE: 0.74 mg/dL (ref 0.44–1.00)
Glucose, Bld: 149 mg/dL — ABNORMAL HIGH (ref 65–99)
Potassium: 4 mmol/L (ref 3.5–5.1)
Sodium: 141 mmol/L (ref 135–145)
Total Bilirubin: 0.6 mg/dL (ref 0.3–1.2)
Total Protein: 6.7 g/dL (ref 6.5–8.1)

## 2016-04-30 LAB — URINE MICROSCOPIC-ADD ON: RBC / HPF: NONE SEEN RBC/hpf (ref 0–5)

## 2016-04-30 LAB — CBG MONITORING, ED: GLUCOSE-CAPILLARY: 162 mg/dL — AB (ref 65–99)

## 2016-04-30 LAB — URINALYSIS, ROUTINE W REFLEX MICROSCOPIC
Bilirubin Urine: NEGATIVE
GLUCOSE, UA: NEGATIVE mg/dL
Hgb urine dipstick: NEGATIVE
Ketones, ur: NEGATIVE mg/dL
Nitrite: NEGATIVE
PH: 6.5 (ref 5.0–8.0)
Protein, ur: NEGATIVE mg/dL
SPECIFIC GRAVITY, URINE: 1.01 (ref 1.005–1.030)

## 2016-04-30 LAB — CBC
HCT: 40.3 % (ref 36.0–46.0)
Hemoglobin: 13.5 g/dL (ref 12.0–15.0)
MCH: 32.9 pg (ref 26.0–34.0)
MCHC: 33.5 g/dL (ref 30.0–36.0)
MCV: 98.3 fL (ref 78.0–100.0)
Platelets: 198 10*3/uL (ref 150–400)
RBC: 4.1 MIL/uL (ref 3.87–5.11)
RDW: 13.3 % (ref 11.5–15.5)
WBC: 6.9 10*3/uL (ref 4.0–10.5)

## 2016-04-30 LAB — DIFFERENTIAL
BASOS ABS: 0.1 10*3/uL (ref 0.0–0.1)
BASOS PCT: 1 %
Eosinophils Absolute: 0.2 10*3/uL (ref 0.0–0.7)
Eosinophils Relative: 3 %
LYMPHS ABS: 1.3 10*3/uL (ref 0.7–4.0)
Lymphocytes Relative: 20 %
MONOS PCT: 6 %
Monocytes Absolute: 0.4 10*3/uL (ref 0.1–1.0)
NEUTROS ABS: 4.8 10*3/uL (ref 1.7–7.7)
NEUTROS PCT: 70 %

## 2016-04-30 LAB — APTT: aPTT: 26 seconds (ref 24–36)

## 2016-04-30 LAB — PROTIME-INR
INR: 1.1
PROTHROMBIN TIME: 14.4 s (ref 11.4–15.2)

## 2016-04-30 LAB — RAPID URINE DRUG SCREEN, HOSP PERFORMED
Amphetamines: NOT DETECTED
Barbiturates: NOT DETECTED
Benzodiazepines: NOT DETECTED
COCAINE: NOT DETECTED
OPIATES: NOT DETECTED
TETRAHYDROCANNABINOL: NOT DETECTED

## 2016-04-30 LAB — ETHANOL

## 2016-04-30 LAB — TROPONIN I: Troponin I: <0.03 ng/mL (ref <0.03)

## 2016-04-30 MED ORDER — BUDESONIDE 0.5 MG/2ML IN SUSP
0.5000 mg | Freq: Two times a day (BID) | RESPIRATORY_TRACT | Status: DC
  Administered 2016-04-30: 0.5 mg via RESPIRATORY_TRACT
  Filled 2016-04-30: qty 2

## 2016-04-30 MED ORDER — ASPIRIN 300 MG RE SUPP: 300.0000 mg | Freq: Every day | RECTAL | Status: DC

## 2016-04-30 MED ORDER — VITAMIN D 1000 UNITS PO TABS
1000.0000 [IU] | ORAL_TABLET | Freq: Every day | ORAL | Status: DC
  Administered 2016-05-01 – 2016-05-02 (×2): 1000 [IU] via ORAL
  Filled 2016-04-30 (×2): qty 1

## 2016-04-30 MED ORDER — SENNOSIDES-DOCUSATE SODIUM 8.6-50 MG PO TABS: 1.0000 | ORAL_TABLET | Freq: Every evening | ORAL | Status: DC | PRN

## 2016-04-30 MED ORDER — ACETAMINOPHEN 650 MG RE SUPP: 650.0000 mg | RECTAL | Status: DC | PRN

## 2016-04-30 MED ORDER — BUDESONIDE 0.5 MG/2ML IN SUSP: 0.5000 mg | Freq: Two times a day (BID) | RESPIRATORY_TRACT | Status: DC | PRN

## 2016-04-30 MED ORDER — STROKE: EARLY STAGES OF RECOVERY BOOK
Freq: Once | Status: DC
  Filled 2016-04-30: qty 1

## 2016-04-30 MED ORDER — LISINOPRIL 10 MG PO TABS
10.0000 mg | ORAL_TABLET | Freq: Every day | ORAL | Status: DC
  Administered 2016-05-01 – 2016-05-02 (×2): 10 mg via ORAL
  Filled 2016-04-30 (×2): qty 1

## 2016-04-30 MED ORDER — DORZOLAMIDE HCL 2 % OP SOLN
1.0000 [drp] | Freq: Three times a day (TID) | OPHTHALMIC | Status: DC
  Administered 2016-05-01 – 2016-05-02 (×4): 1 [drp] via OPHTHALMIC
  Filled 2016-04-30: qty 10

## 2016-04-30 MED ORDER — SODIUM CHLORIDE 0.9 % IV SOLN
INTRAVENOUS | Status: DC
  Administered 2016-04-30: 19:00:00 via INTRAVENOUS

## 2016-04-30 MED ORDER — LATANOPROST 0.005 % OP SOLN
1.0000 [drp] | Freq: Every day | OPHTHALMIC | Status: DC
  Administered 2016-05-01: 1 [drp] via OPHTHALMIC
  Filled 2016-04-30: qty 2.5

## 2016-04-30 MED ORDER — ACETAMINOPHEN 325 MG PO TABS: 650.0000 mg | ORAL_TABLET | ORAL | Status: DC | PRN

## 2016-04-30 MED ORDER — ASPIRIN 325 MG PO TABS
325.0000 mg | ORAL_TABLET | Freq: Every day | ORAL | Status: DC
  Administered 2016-04-30 – 2016-05-02 (×3): 325 mg via ORAL
  Filled 2016-04-30 (×3): qty 1

## 2016-04-30 MED ORDER — LEVOTHYROXINE SODIUM 88 MCG PO TABS
88.0000 ug | ORAL_TABLET | Freq: Every day | ORAL | Status: DC
Start: 2016-05-01 — End: 2016-05-02
  Administered 2016-05-01 – 2016-05-02 (×2): 88 ug via ORAL
  Filled 2016-04-30 (×2): qty 1

## 2016-04-30 NOTE — ED Notes (Signed)
MD at bedside.  Pt back from MRI.

## 2016-04-30 NOTE — ED Provider Notes (Signed)
[facility]-EMERGENCY DEPT Provider Note   CSN: [id number] Arrival date & time: 04/30/16  1035  First Provider Contact:  1135     History   Chief Complaint Chief Complaint  Patient presents with  . Numbness    HPI Nancy [name] is a 82 y.o. female.  HPI Pt was seen at 1135. Per pt and her family, c/o gradual onset and persistence of constant RUE and RLE "numbness" that began yesterday approximately 1800. Has been associated with right sided "weakness" and feeling "off balance" while walking. Pt called her PMD today and was told to come to the ED for further evaluation. Denies CP/palpitations, no SOB/cough, no abd pain, no N/V/D, no fevers, no falls, no facial droop, no slurred speech, no visual changes.    Past Medical History:  Diagnosis Date  . DVT (deep venous thrombosis) (HCC)    LT foot  . Glaucoma   . Hypertension   . Legally blind   . Thyroid disease     There are no active problems to display for this patient.   Past Surgical History:  Procedure Laterality Date  . NASAL SINUS SURGERY    . SPINE SURGERY         Home Medications    Prior to Admission medications   Medication Sig Start Date End Date Taking? Authorizing Provider  budesonide (PULMICORT) 0.5 MG/2ML nebulizer solution Take 0.5 mg by nebulization 2 (two) times daily.   Yes Historical Provider, MD  Cholecalciferol (VITAMIN D-3) 1000 UNITS CAPS Take 1,000 Units by mouth daily.    Yes Historical Provider, MD  dorzolamide (TRUSOPT) 2 % ophthalmic solution Place 1 drop into both eyes 3 (three) times daily.    Yes Historical Provider, MD  latanoprost (XALATAN) 0.005 % ophthalmic solution Place 1 drop into both eyes at bedtime.    Yes Historical Provider, MD  levothyroxine (SYNTHROID, LEVOTHROID) 88 MCG tablet Take 88 mcg by mouth daily before breakfast.   Yes Historical Provider, MD  lisinopril (PRINIVIL,ZESTRIL) 10 MG tablet Take 10 mg by mouth daily.   Yes Historical Provider, MD  DiphenhydrAMINE HCl  (ALLERGY MED PO) Take 1 capsule by mouth daily as needed (insomnia,allergies).     Historical Provider, MD    Family History   Social History Social History  Substance Use Topics  . Smoking status: Never Smoker  . Smokeless tobacco: Never Used  . Alcohol use No     Allergies   Review of patient's allergies indicates no known allergies.   Review of Systems Review of Systems ROS: Statement: All systems negative except as marked or noted in the HPI; Constitutional: Negative for fever and chills. ; ; Eyes: Negative for eye pain, redness and discharge. ; ; ENMT: Negative for ear pain, hoarseness, nasal congestion, sinus pressure and sore throat. ; ; Cardiovascular: Negative for chest pain, palpitations, diaphoresis, dyspnea and peripheral edema. ; ; Respiratory: Negative for cough, wheezing and stridor. ; ; Gastrointestinal: Negative for nausea, vomiting, diarrhea, abdominal pain, blood in stool, hematemesis, jaundice and rectal bleeding. . ; ; Genitourinary: Negative for dysuria, flank pain and hematuria. ; ; Musculoskeletal: Negative for back pain and neck pain. Negative for swelling and trauma.; ; Skin: Negative for pruritus, rash, abrasions, blisters, bruising and skin lesion.; ; Neuro: +numbness, weakness, "off balance" with walking. Negative for headache, lightheadedness and neck stiffness. Negative for weakness, altered level of consciousness, altered mental status, involuntary movement, seizure and syncope.      Physical Exam Updated Vital Signs BP 162/78  Pulse 79   Temp 97.9 F (36.6 C) (Oral)   Resp 12   SpO2 98%   Physical Exam 1140: Physical examination:  Nursing notes reviewed; Vital signs and O2 SAT reviewed;  Constitutional: Well developed, Well nourished, Well hydrated, In no acute distress; Head:  Normocephalic, atraumatic; Eyes: EOMI, PERRL, No scleral icterus; ENMT: Mouth and pharynx normal, Mucous membranes moist; Neck: Supple, Full range of motion, No  lymphadenopathy; Cardiovascular: Regular rate and rhythm, No gallop; Respiratory: Breath sounds clear & equal bilaterally, No wheezes.  Speaking full sentences with ease, Normal respiratory effort/excursion; Chest: Nontender, Movement normal; Abdomen: Soft, Nontender, Nondistended, Normal bowel sounds; Genitourinary: No CVA tenderness; Extremities: Pulses normal, No tenderness, No edema, No calf edema or asymmetry.; Neuro: AA&Ox3, Major CN grossly intact. Speech clear.  No facial droop. Grips equal. Strength 5/5 equal bilat UE's and LLE; 4/5 strength RLE. +subjective decreased sensation RUE and RLE, otherwise no gross sensory deficits.  Normal cerebellar testing bilat UE's (finger-nose) and LLE (heel-shin), ataxic RLE.; Skin: Color normal, Warm, Dry.   ED Treatments / Results  Labs (all labs ordered are listed, but only abnormal results are displayed)   EKG  EKG Interpretation  Date/Time:  Wednesday April 30 2016 10:46:42 EDT Ventricular Rate:  86 PR Interval:    QRS Duration: 89 QT Interval:  370 QTC Calculation: 443 R Axis:   57 Text Interpretation:  Sinus rhythm Baseline wander When compared with ECG of 02/24/2008 No significant change was found Confirmed by [facility]  MD, Nicholos Johns [id number]) on 04/30/2016 12:05:39 PM       Radiology   Procedures Procedures (including critical care time)  Medications Ordered in ED Medications - No data to display   Initial Impression / Assessment and Plan / ED Course  I have reviewed the triage vital signs and the nursing notes.  Pertinent labs & imaging results that were available during my care of the patient were reviewed by me and considered in my medical decision making (see chart for details).  MDM Reviewed: previous chart, nursing note and vitals Reviewed previous: labs and ECG Interpretation: labs, ECG and MRI    Results for orders placed or performed during the hospital encounter of 04/30/16  Ethanol  Result Value Ref Range    Alcohol, Ethyl (B) <5 <5 mg/dL  Protime-INR  Result Value Ref Range   Prothrombin Time 14.4 11.4 - 15.2 seconds   INR 1.10   APTT  Result Value Ref Range   aPTT 26 24 - 36 seconds  CBC  Result Value Ref Range   WBC 6.9 4.0 - 10.5 K/uL   RBC 4.10 3.87 - 5.11 MIL/uL   Hemoglobin 13.5 12.0 - 15.0 g/dL   HCT 91.6 94.5 - 03.8 %   MCV 98.3 78.0 - 100.0 fL   MCH 32.9 26.0 - 34.0 pg   MCHC 33.5 30.0 - 36.0 g/dL   RDW 88.2 80.0 - 34.9 %   Platelets 198 150 - 400 K/uL  Differential  Result Value Ref Range   Neutrophils Relative % 70 %   Neutro Abs 4.8 1.7 - 7.7 K/uL   Lymphocytes Relative 20 %   Lymphs Abs 1.3 0.7 - 4.0 K/uL   Monocytes Relative 6 %   Monocytes Absolute 0.4 0.1 - 1.0 K/uL   Eosinophils Relative 3 %   Eosinophils Absolute 0.2 0.0 - 0.7 K/uL   Basophils Relative 1 %   Basophils Absolute 0.1 0.0 - 0.1 K/uL  Comprehensive metabolic panel  Result Value Ref  Range   Sodium 141 135 - 145 mmol/L   Potassium 4.0 3.5 - 5.1 mmol/L   Chloride 108 101 - 111 mmol/L   CO2 27 22 - 32 mmol/L   Glucose, Bld 149 (H) 65 - 99 mg/dL   BUN 15 6 - 20 mg/dL   Creatinine, Ser 1.61 0.44 - 1.00 mg/dL   Calcium 8.8 (L) 8.9 - 10.3 mg/dL   Total Protein 6.7 6.5 - 8.1 g/dL   Albumin 3.6 3.5 - 5.0 g/dL   AST 21 15 - 41 U/L   ALT 14 14 - 54 U/L   Alkaline Phosphatase 84 38 - 126 U/L   Total Bilirubin 0.6 0.3 - 1.2 mg/dL   GFR calc non Af Amer >60 >60 mL/min   GFR calc Af Amer >60 >60 mL/min   Anion gap 6 5 - 15  Troponin I  Result Value Ref Range   Troponin I <0.03 <0.03 ng/mL  CBG monitoring, ED  Result Value Ref Range   Glucose-Capillary 162 (H) 65 - 99 mg/dL   Mr Brain Wo Contrast (neuro Protocol) Result Date: 04/30/2016 CLINICAL DATA:  Right-sided weakness and numbness.  Dizziness. EXAM: MRI HEAD WITHOUT CONTRAST TECHNIQUE: Multiplanar, multiecho pulse sequences of the brain and surrounding structures were obtained without intravenous contrast. COMPARISON:  MRI 06/05/2006 FINDINGS:  Generalized atrophy with mild progression from the prior study. Negative for hydrocephalus. Multiple small areas of restricted diffusion compatible with acute infarction. Small area of acute infarct left cerebellar vermis Small infarct left lateral thalamus Small acute infarct high right parietal lobe. Moderate chronic microvascular ischemic changes are seen throughout the cerebral white matter. Mild chronic ischemic change in the pons. Small chronic infarct right cerebellum. Negative for hemorrhage or mass lesion. No shift of the midline structures. Extensive mucosal edema throughout the paranasal sinuses. Bilateral lens extraction. No orbital mass lesion. Pituitary not enlarged. IMPRESSION: Small subcentimeter areas of acute infarct in the left cerebellar vermis, left lateral thalamus, and right high parietal lobe. Question small emboli. Atrophy and moderate chronic ischemic change Extensive mucosal thickening throughout the paranasal sinuses. Electronically Signed   By: Marlan Palau M.D.   On: 04/30/2016 12:26   1250:  Dx and testing d/w pt and family.  Questions answered.  Verb understanding, agreeable to admit. T/C to [facility]. Kerry Hough, case discussed, including:  HPI, pertinent PM/SHx, VS/PE, dx testing, ED course and treatment:  Agreeable to admit, requests to write temporary orders, obtain tele bed to Dr. Alonza Smoker service.  .  Final Clinical Impressions(s) / ED Diagnoses   Final diagnoses:  None    New Prescriptions New Prescriptions   No medications on file     Samuel Jester, DO 05/03/16 1227

## 2016-04-30 NOTE — ED Triage Notes (Signed)
Pt sent over by Dr. Ouida Sills for evaluation of RT arm and RT leg numbness that began around 1800 yesterday. Pt denies any blurred vision, facial droop, or slurred speech. Pt not on blood thinners. Reports hx of DVT in LT foot in the past.

## 2016-04-30 NOTE — ED Notes (Signed)
Pt still in MRI, family at bedside.

## 2016-04-30 NOTE — H&P (Signed)
History and Physical    Nancy [name] HQR:[id number] DOB: 21-Sep-1925 DOA: 04/30/2016  PCP: Carylon Perches, MD  Patient coming from: home  Chief Complaint: right sided weakness  HPI: Nancy [name] is a 82 y.o. female with medical history significant of hypertension and hypothyroidism. Patient reports that she was in her usual state of health when yesterday at approximately 6 PM she noted numbness of her right foot. Patient did not seek any medical attention at that time. Overnight, she developed weakness of the right leg and had difficulty ambulating. This morning she had noticed that her right hand was numb and her entire right side "did not feel right". Did not have any changes in her vision. No slurring of her speech or dysphagia. Denies any chest pain or shortness of breath. She was brought to the ER for evaluation  ED Course: In the ED, MRI brain confirmed acute stroke. She feels that her symptoms are relatively unchanged. EKG was nonacute. Otherwise workup was unremarkable. She's been referred for admission for further workup of stroke.  Review of Systems: As per HPI otherwise 10 point review of systems negative.    Past Medical History:  Diagnosis Date  . DVT (deep venous thrombosis) (HCC)    LT foot  . Glaucoma   . Hypertension   . Legally blind   . Thyroid disease     Past Surgical History:  Procedure Laterality Date  . NASAL SINUS SURGERY    . SPINE SURGERY       reports that she has never smoked. She has never used smokeless tobacco. She reports that she does not drink alcohol or use drugs.  No Known Allergies  Family history:  Family history reviewed and not pertinent   Prior to Admission medications   Medication Sig Start Date End Date Taking? Authorizing Provider  budesonide (PULMICORT) 0.5 MG/2ML nebulizer solution Take 0.5 mg by nebulization 2 (two) times daily.   Yes Historical Provider, MD  Cholecalciferol (VITAMIN D-3) 1000 UNITS CAPS Take 1,000 Units by  mouth daily.    Yes Historical Provider, MD  dorzolamide (TRUSOPT) 2 % ophthalmic solution Place 1 drop into both eyes 3 (three) times daily.    Yes Historical Provider, MD  latanoprost (XALATAN) 0.005 % ophthalmic solution Place 1 drop into both eyes at bedtime.    Yes Historical Provider, MD  levothyroxine (SYNTHROID, LEVOTHROID) 88 MCG tablet Take 88 mcg by mouth daily before breakfast.   Yes Historical Provider, MD  lisinopril (PRINIVIL,ZESTRIL) 10 MG tablet Take 10 mg by mouth daily.   Yes Historical Provider, MD  DiphenhydrAMINE HCl (ALLERGY MED PO) Take 1 capsule by mouth daily as needed (insomnia,allergies).     Historical Provider, MD    Physical Exam: Vitals:   04/30/16 1400 04/30/16 1457 04/30/16 1521 04/30/16 1721  BP: 185/84 (!) 182/74 (!) 182/74 (!) 160/77  Pulse: 80 80 78 86  Resp: 14 18 18 18   Temp:  99 F (37.2 C) 99 F (37.2 C) 98.2 F (36.8 C)  TempSrc:  Oral Oral Oral  SpO2: 100% 100% 100% 98%  Weight:  59.3 kg (130 lb 11.7 oz)    Height:  5\' 6"  (1.676 m)        Constitutional: NAD, calm, comfortable Vitals:   04/30/16 1400 04/30/16 1457 04/30/16 1521 04/30/16 1721  BP: 185/84 (!) 182/74 (!) 182/74 (!) 160/77  Pulse: 80 80 78 86  Resp: 14 18 18 18   Temp:  99 F (37.2 C) 99 F (37.2 C)  98.2 F (36.8 C)  TempSrc:  Oral Oral Oral  SpO2: 100% 100% 100% 98%  Weight:  59.3 kg (130 lb 11.7 oz)    Height:   (1.676 m)     Eyes: PERRL, lids and conjunctivae normal ENMT: Mucous membranes are moist. Posterior pharynx clear of any exudate or lesions.Normal dentition.  Neck: normal, supple, no masses, no thyromegaly Respiratory: clear to auscultation bilaterally, no wheezing, no crackles. Normal respiratory effort. No accessory muscle use.  Cardiovascular: Regular rate and rhythm, no murmurs / rubs / gallops. No extremity edema. 2+ pedal pulses. No carotid bruits.  Abdomen: no tenderness, no masses palpated. No hepatosplenomegaly. Bowel sounds positive.    Musculoskeletal: no clubbing / cyanosis. No joint deformity upper and lower extremities. Good ROM, no contractures. Normal muscle tone.  Skin: no rashes, lesions, ulcers. No induration Neurologic: CN 2-12 grossly intact. Sensation intact, DTR normal. Strength 3/5 in RLE, otherwise 5/5 Psychiatric: Normal judgment and insight. Alert and oriented x 3. Normal mood.     Labs on Admission: I have personally reviewed following labs and imaging studies  CBC:  Recent Labs Lab 04/30/16 1115  WBC 6.9  NEUTROABS 4.8  HGB 13.5  HCT 40.3  MCV 98.3  PLT 198   Basic Metabolic Panel:  Recent Labs Lab 04/30/16 1115  NA 141  K 4.0  CL 108  CO2 27  GLUCOSE 149*  BUN 15  CREATININE 0.74  CALCIUM 8.8*   GFR: Estimated Creatinine Clearance: 45.5 mL/min (by C-G formula based on SCr of 0.8 mg/dL). Liver Function Tests:  Recent Labs Lab 04/30/16 1115  AST 21  ALT 14  ALKPHOS 84  BILITOT 0.6  PROT 6.7  ALBUMIN 3.6   No results for input(s): LIPASE, AMYLASE in the last 168 hours. No results for input(s): AMMONIA in the last 168 hours. Coagulation Profile:  Recent Labs Lab 04/30/16 1115  INR 1.10   Cardiac Enzymes:  Recent Labs Lab 04/30/16 1115  TROPONINI <0.03   BNP (last 3 results) No results for input(s): PROBNP in the last 8760 hours. HbA1C: No results for input(s): HGBA1C in the last 72 hours. CBG:  Recent Labs Lab 04/30/16 1047  GLUCAP 162*   Lipid Profile: No results for input(s): CHOL, HDL, LDLCALC, TRIG, CHOLHDL, LDLDIRECT in the last 72 hours. Thyroid Function Tests: No results for input(s): TSH, T4TOTAL, FREET4, T3FREE, THYROIDAB in the last 72 hours. Anemia Panel: No results for input(s): VITAMINB12, FOLATE, FERRITIN, TIBC, IRON, RETICCTPCT in the last 72 hours. Urine analysis:    Component Value Date/Time   COLORURINE STRAW (A) 04/30/2016 1305   APPEARANCEUR CLEAR 04/30/2016 1305   LABSPEC 1.010 04/30/2016 1305   PHURINE 6.5 04/30/2016 1305    GLUCOSEU NEGATIVE 04/30/2016 1305   HGBUR NEGATIVE 04/30/2016 1305   BILIRUBINUR NEGATIVE 04/30/2016 1305   KETONESUR NEGATIVE 04/30/2016 1305   PROTEINUR NEGATIVE 04/30/2016 1305   NITRITE NEGATIVE 04/30/2016 1305   LEUKOCYTESUR TRACE (A) 04/30/2016 1305   Sepsis Labs: !!!!!!!!!!!!!!!!!!!!!!!!!!!!!!!!!!!!!!!!!!!! (procalcitonin:4,lacticidven:4) )No results found for this or any previous visit (from the past 240 hour(s)).   Radiological Exams on Admission: Mr Brain Wo Contrast (neuro Protocol)  Result Date: 04/30/2016 CLINICAL DATA:  Right-sided weakness and numbness.  Dizziness. EXAM: MRI HEAD WITHOUT CONTRAST TECHNIQUE: Multiplanar, multiecho pulse sequences of the brain and surrounding structures were obtained without intravenous contrast. COMPARISON:  MRI 06/05/2006 FINDINGS: Generalized atrophy with mild progression from the prior study. Negative for hydrocephalus. Multiple small areas of restricted diffusion compatible with acute infarction.  Small area of acute infarct left cerebellar vermis Small infarct left lateral thalamus Small acute infarct high right parietal lobe. Moderate chronic microvascular ischemic changes are seen throughout the cerebral white matter. Mild chronic ischemic change in the pons. Small chronic infarct right cerebellum. Negative for hemorrhage or mass lesion. No shift of the midline structures. Extensive mucosal edema throughout the paranasal sinuses. Bilateral lens extraction. No orbital mass lesion. Pituitary not enlarged. IMPRESSION: Small subcentimeter areas of acute infarct in the left cerebellar vermis, left lateral thalamus, and right high parietal lobe. Question small emboli. Atrophy and moderate chronic ischemic change Extensive mucosal thickening throughout the paranasal sinuses. Electronically Signed   By: Marlan Palau M.D.   On: 04/30/2016 12:26   EKG: Independently reviewed. Sinus rhythm, no acute changes  Assessment/Plan Active  Problems:   Stroke [facility])   Right sided weakness   Benign essential HTN   Hypothyroidism    Acute CVA. Patient found to have small infarcts on the left and right side. Concerning for emboli. She will undergo MRA head, carotid Dopplers and echocardiogram. We will consult neurology. The patient does not take any antiplatelet agents at baseline. We'll start her on daily aspirin. Check hemoglobin A1c and lipid panel. Physical therapy and occupational therapy consultations.  Right-sided weakness . related to stroke. Physical therapy will be consulted.  Hypertension. Stable. Continue outpatient regimen  Hypothyroidism. Continue Synthroid   DVT prophylaxis: lovenox Code Status: full code Family Communication: discussed with 4 children at the bedside Disposition Plan: pending physical therapy eval Consults called: neurology Admission status: inpatient, telemetry   MEMON,JEHANZEB MD [facility] Pager [phone]  If 7PM-7AM, please contact night-coverage [email or web address] Password [email or web address]  04/30/2016, 6:15 PM

## 2016-04-30 NOTE — ED Notes (Signed)
Pt gone to MR

## 2016-04-30 NOTE — ED Notes (Signed)
Pt is in MRI. Will complete 2 hour neuros when she returns.

## 2016-05-01 ENCOUNTER — Encounter (HOSPITAL_COMMUNITY): Payer: Self-pay | Admitting: Physician Assistant

## 2016-05-01 ENCOUNTER — Inpatient Hospital Stay (HOSPITAL_COMMUNITY): Payer: Medicare Other

## 2016-05-01 DIAGNOSIS — I6789 Other cerebrovascular disease: Secondary | ICD-10-CM

## 2016-05-01 DIAGNOSIS — I639 Cerebral infarction, unspecified: Secondary | ICD-10-CM

## 2016-05-01 DIAGNOSIS — R739 Hyperglycemia, unspecified: Secondary | ICD-10-CM

## 2016-05-01 DIAGNOSIS — I10 Essential (primary) hypertension: Secondary | ICD-10-CM

## 2016-05-01 DIAGNOSIS — E785 Hyperlipidemia, unspecified: Secondary | ICD-10-CM

## 2016-05-01 DIAGNOSIS — L8990 Pressure ulcer of unspecified site, unspecified stage: Secondary | ICD-10-CM | POA: Insufficient documentation

## 2016-05-01 LAB — GLUCOSE, CAPILLARY
Glucose-Capillary: 101 mg/dL — ABNORMAL HIGH (ref 65–99)
Glucose-Capillary: 135 mg/dL — ABNORMAL HIGH (ref 65–99)

## 2016-05-01 LAB — LIPID PANEL
Cholesterol: 204 mg/dL — ABNORMAL HIGH (ref 0–200)
HDL: 45 mg/dL (ref >40)
LDL CALC: 143 mg/dL — AB (ref 0–99)
Total CHOL/HDL Ratio: 4.5 RATIO
Triglycerides: 80 mg/dL (ref <150)
VLDL: 16 mg/dL (ref 0–40)

## 2016-05-01 LAB — ECHOCARDIOGRAM COMPLETE
HEIGHTINCHES: 66 in
WEIGHTICAEL: 2091.72 [oz_av]

## 2016-05-01 MED ORDER — ATORVASTATIN CALCIUM 20 MG PO TABS
20.0000 mg | ORAL_TABLET | Freq: Every day | ORAL | Status: DC
  Administered 2016-05-01: 20 mg via ORAL
  Filled 2016-05-01: qty 1

## 2016-05-01 MED ORDER — ENOXAPARIN SODIUM 40 MG/0.4ML ~~LOC~~ SOLN
40.0000 mg | SUBCUTANEOUS | Status: DC
  Administered 2016-05-01: 40 mg via SUBCUTANEOUS
  Filled 2016-05-01 (×2): qty 0.4

## 2016-05-01 NOTE — Evaluation (Signed)
Occupational Therapy Evaluation Patient Details Name: Nancy [name] MRN: [id number] DOB: 05-31-1925 Today's Date: 05/01/2016    History of Present Illness Nancy [name] is a 82 y.o. female with medical history significant of hypertension and hypothyroidism. Patient reports that she was in her usual state of health when yesterday at approximately 6 PM she noted numbness of her right foot. Patient did not seek any medical attention at that time. Overnight, she developed weakness of the right leg and had difficulty ambulating. This morning she had noticed that her right hand was numb and her entire right side "did not feel right". Did not have any changes in her vision. No slurring of her speech or dysphagia. Denies any chest pain or shortness of breath. In the ED, MRI brain confirmed acute stroke. She feels that her symptoms are relatively unchanged. EKG was nonacute. Otherwise workup was unremarkable. She's been referred for admission for further workup of stroke.   Clinical Impression   Pt awake, alert, agreeable to OT evaluation. PT working with pt on OT arrival. Pt completed ADL tasks with supervision, assistance required for gowns with snaps. Pt BUE strength is WFL, coordination and sensation intact. Pt is at baseline with ADL completion and functional mobility, daughters and family available to provide assistance and supervision as needed. No further OT services required at this time.     Follow Up Recommendations  No OT follow up;Supervision - Intermittent    Equipment Recommendations  None recommended by OT       Precautions / Restrictions Precautions Precautions: None Restrictions Weight Bearing Restrictions: No      Mobility Bed Mobility Overal bed mobility: Modified Independent                Transfers Overall transfer level: Modified independent                         ADL Overall ADL's : Needs assistance/impaired     Grooming: Wash/dry hands;Oral  care;Supervision/safety;Standing           Upper Body Dressing : Minimal assistance;Standing Upper Body Dressing Details (indicate cue type and reason): Assistance required for hospital gown snaps Lower Body Dressing: Supervision/safety;Sitting/lateral leans   Toilet Transfer: Supervision/safety;Ambulation;Regular Toilet;Grab bars;RW           Functional mobility during ADLs: Supervision/safety;Rolling walker       Vision Vision Assessment?: No apparent visual deficits          Pertinent Vitals/Pain Pain Assessment: No/denies pain     Hand Dominance Right   Extremity/Trunk Assessment Upper Extremity Assessment Upper Extremity Assessment: Overall WFL for tasks assessed   Lower Extremity Assessment Lower Extremity Assessment: Defer to PT evaluation   Cervical / Trunk Assessment Cervical / Trunk Assessment: Normal   Communication Communication Communication: No difficulties   Cognition Arousal/Alertness: Awake/alert Behavior During Therapy: WFL for tasks assessed/performed Overall Cognitive Status: Within Functional Limits for tasks assessed                                Home Living Family/patient expects to be discharged to:: Private residence Living Arrangements: Children (son and daugther-in-law who is a Engineer, civil (consulting))   Type of Home: House Home Access: Stairs to enter Secretary/administrator of Steps: 4  Entrance Stairs-Rails: Left Home Layout: One level     Bathroom Shower/Tub: Producer, television/film/video: Handicapped height     Home Equipment:  Walker - 4 wheels;Shower seat - built in;Hand held shower head          Prior Functioning/Environment Level of Independence: Independent with assistive device(s)  Gait / Transfers Assistance Needed: Pt uses the rollator all the time for ambulation.  ADL's / Homemaking Assistance Needed: independent with ADL's, no driving - son and dil assist with running errands, and other dtr's.           End of Session Equipment Utilized During Treatment: Gait belt;Rolling walker  Activity Tolerance: Patient tolerated treatment well Patient left: in chair;with call bell/phone within reach;with family/visitor present   Time: 1610-9604 OT Time Calculation (min): 24 min Charges:  OT General Charges $OT Visit: 1 Procedure OT Evaluation $OT Eval Low Complexity: 1 Procedure  Ezra Sites, OTR/L  [phone] 05/01/2016, 10:02 AM

## 2016-05-01 NOTE — Care Management Note (Signed)
Case Management Note  Patient Details  Name: Nancy [name] MRN: [id number] Date of Birth: 05/13/1925  Subjective/Objective:   Patient adm from home, lives with son. Has walker at home. Reports lots of family support. Has PCP and insurance, reports no issues. PT has recommended OP PT. Patient agreeable.                 Action/Plan: Will fax order for OPPT. Patient aware  Rehab facility will call and arrange appointment time after discharge. No other CM needs.    Expected Discharge Date:                  Expected Discharge Plan:  Home/Self Care  In-House Referral:  NA  Discharge planning Services  CM Consult  Post Acute Care Choice:  NA Choice offered to:  NA  DME Arranged:    DME Agency:     HH Arranged:    HH Agency:     Status of Service:  Completed, signed off  If discussed at [employer] of Stay Meetings, dates discussed:    Additional Comments:  Chynna Buerkle, Chrystine Oiler, RN 05/01/2016, 2:33 PM

## 2016-05-01 NOTE — Evaluation (Signed)
Physical Therapy Evaluation Patient Details Name: Nancy [name] MRN: [id number] DOB: 09/02/1925 Today's Date: 05/01/2016   History of Present Illness  82 yo F admitted 04/30/2016 for R sided weakness.  MRI confirmed acute stroke with small infarcts on the left and right side concerning for emboli.  PMH: HTN, and hypothyroidism, DVT L foot, glaucoma, legally blind, spine surgery.   Clinical Impression  Pt received in bed, 2 daughters present, and pt is agreeable to PT evaluation.  Pt expressed that prior to admission, she was ambulating with a rollator, but was independent with ADL's.  Today during PT evaluation she ambulated 300ft with RW and supervision.  No overt gait deviations noted, however pt expressed that she felt her R LE was weaker.  Pt is recommended for f/u with OPPT for further strength and balance training.     Follow Up Recommendations Outpatient PT    Equipment Recommendations  None recommended by PT    Recommendations for Other Services       Precautions / Restrictions Precautions Precautions: None Restrictions Weight Bearing Restrictions: No      Mobility  Bed Mobility Overal bed mobility: Modified Independent                Transfers Overall transfer level: Modified independent Equipment used: Rolling walker (2 wheeled)                Ambulation/Gait Ambulation/Gait assistance: Supervision Ambulation Distance (Feet): 300 Feet Assistive device: Rolling walker (2 wheeled)     Gait velocity interpretation: <1.8 ft/sec, indicative of risk for recurrent falls General Gait Details: No overt gait deviations, however, pt states that she feels like her R leg is weak.   Stairs            Wheelchair Mobility    Modified Rankin (Stroke Patients Only)       Balance Overall balance assessment: Modified Independent         Standing balance support: Bilateral upper extremity supported Standing balance-Leahy Scale: Good                                Pertinent Vitals/Pain Pain Assessment: No/denies pain    Home Living Family/patient expects to be discharged to:: Private residence Living Arrangements: Children (son and daugther-in-law who is a nurse)   Type of Home: House Home Access: Stairs to enter Entrance Stairs-Rails: Left Entrance Stairs-Number of Steps: 4  Home Layout: One level Home Equipment: Walker - 4 wheels;Shower seat - built in;Hand held shower head      Prior Function Level of Independence: Independent with assistive device(s)   Gait / Transfers Assistance Needed: Pt uses the rollator all the time for ambulation.   ADL's / Homemaking Assistance Needed: independent with ADL's, no driving - son and dil assist with running errands, and other dtr's.         Hand Dominance   Dominant Hand: Right    Extremity/Trunk Assessment   Upper Extremity Assessment: Defer to OT evaluation           Lower Extremity Assessment: Overall WFL for tasks assessed      Cervical / Trunk Assessment: Normal  Communication   Communication: No difficulties  Cognition Arousal/Alertness: Awake/alert Behavior During Therapy: WFL for tasks assessed/performed Overall Cognitive Status: Within Functional Limits for tasks assessed                        General Comments      Exercises        Assessment/Plan    PT Assessment All further PT needs can be met in the next venue of care  PT Diagnosis Generalized weakness   PT Problem List Decreased strength;Decreased activity tolerance;Decreased balance;Decreased mobility  PT Treatment Interventions     PT Goals (Current goals can be found in the Care Plan section) Acute Rehab PT Goals Patient Stated Goal: Pt wants to get stronger.  PT Goal Formulation: With patient/family Time For Goal Achievement: 05/08/16 Potential to Achieve Goals: Good    Frequency     Barriers to discharge        Co-evaluation               End of  Session Equipment Utilized During Treatment: Gait belt Activity Tolerance: Patient tolerated treatment well Patient left: with family/visitor present (In bathroom with OT) Nurse Communication: Mobility status    Functional Assessment Tool Used: [employer] AM-PAC "6-clicks"  Functional Limitation: Mobility: Walking and moving around Mobility: Walking and Moving Around Current Status (G8978): At least 20 percent but less than 40 percent impaired, limited or restricted Mobility: Walking and Moving Around Goal Status (G8979): At least 20 percent but less than 40 percent impaired, limited or restricted Mobility: Walking and Moving Around Discharge Status (G8980): At least 20 percent but less than 40 percent impaired, limited or restricted    Time: 0847-0915 PT Time Calculation (min) (ACUTE ONLY): 28 min   Charges:   PT Evaluation $PT Eval Low Complexity: 1 Procedure PT Treatments $Gait Training: 8-22 mins   PT G Codes:   PT G-Codes **NOT FOR INPATIENT CLASS** Functional Assessment Tool Used: [employer] AM-PAC "6-clicks"  Functional Limitation: Mobility: Walking and moving around Mobility: Walking and Moving Around Current Status (G8978): At least 20 percent but less than 40 percent impaired, limited or restricted Mobility: Walking and Moving Around Goal Status (G8979): At least 20 percent but less than 40 percent impaired, limited or restricted Mobility: Walking and Moving Around Discharge Status (G8980): At least 20 percent but less than 40 percent impaired, limited or restricted    Beth Dininger, PT, DPT X: [phone]    

## 2016-05-01 NOTE — Progress Notes (Signed)
*  PRELIMINARY RESULTS* Echocardiogram 2D Echocardiogram has been performed.  Jeryl Columbia 05/01/2016, 1:56 PM

## 2016-05-01 NOTE — Consult Note (Signed)
Cardiology Consultation Note    Patient ID: Nancy [name], MRN: [id number], DOB/AGE: [birth year] 82 y.o. Admit date: 04/30/2016   Date of Consult: 05/01/2016 Primary Physician: Carylon Perches, MD Primary Cardiologist: New to Dr. Purvis Sheffield  Chief Complaint: right foot weakness Reason for Consultation: embolic stroke Requesting MD: Dr. Ouida Sills  HPI: Nancy [name] is a 82 y.o. female (who appears younger than stated age) with history of hypothyroidism, HTN, hyperlipidemia, LLE DVT 07/2015, back surgery who presented to [facility] with stroke. She has no prior cardiac history. She was on Xarelto for 3 months after her DVT which was felt provoked by [facility] per d/w Dr. Ouida Sills. She had no issues with bleeding at that time. She walks with a walker as a precaution but has not had any falls.  The night before last (7/25) she noticed numbness of her right foot. When she got up in the middle of the night to use the restroom she noticed that she couldn't quite feel her foot planting on the ground. Yesterday morning she noticed she still had right sided weakness. No slurred speech, change in chronic blurry vision, or aphasia. She called her PCP and was advised to come to the hospital. MRI/MRA showed small acute infarct in the left cerebellar vermis, left lateral thalamus, and right high parietal lobe; question small emboli, hypoplastic left vertebral artery without definite significant proximal stenosis, aneurysm, or branch vessel occlusion; mild distal small vessel disease otherwise. Carotid duplex negative. She has been intermittently hypertensive during this stay up to 191/85. 2D echo pending. Labs notable for neg troponin, LDL 143, normal CBC, glucose 149, neg UDS, neg EtOH. A1c pending. She has been started on atorvastatin.   Cardiology is asked to weigh in regarding loop recorder. Pt denies palpitations, chest pain, dyspnea. Telemetry unrevealing - NSR.  Past Medical History:  Diagnosis Date  . DVT (deep venous  thrombosis) (HCC)    a. left lower extremity 07/2015.  . Glaucoma   . Hypertension   . Hypothyroidism   . Legally blind       Surgical History:  Past Surgical History:  Procedure Laterality Date  . NASAL SINUS SURGERY    . SPINE SURGERY       Home Meds: Prior to Admission medications   Medication Sig Start Date End Date Taking? Authorizing Provider  budesonide (PULMICORT) 0.5 MG/2ML nebulizer solution Take 0.5 mg by nebulization 2 (two) times daily.   Yes Historical Provider, MD  Cholecalciferol (VITAMIN D-3) 1000 UNITS CAPS Take 1,000 Units by mouth daily.    Yes Historical Provider, MD  dorzolamide (TRUSOPT) 2 % ophthalmic solution Place 1 drop into both eyes 3 (three) times daily.    Yes Historical Provider, MD  latanoprost (XALATAN) 0.005 % ophthalmic solution Place 1 drop into both eyes at bedtime.    Yes Historical Provider, MD  levothyroxine (SYNTHROID, LEVOTHROID) 88 MCG tablet Take 88 mcg by mouth daily before breakfast.   Yes Historical Provider, MD  lisinopril (PRINIVIL,ZESTRIL) 10 MG tablet Take 10 mg by mouth daily.   Yes Historical Provider, MD  DiphenhydrAMINE HCl (ALLERGY MED PO) Take 1 capsule by mouth daily as needed (insomnia,allergies).     Historical Provider, MD    Inpatient Medications:  .  stroke: mapping our early stages of recovery book   Does not apply Once  . aspirin  300 mg Rectal Daily   Or  . aspirin  325 mg Oral Daily  . atorvastatin  20 mg Oral q1800  . cholecalciferol  1,000 Units Oral Daily  . dorzolamide  1 drop Both Eyes TID  . enoxaparin (LOVENOX) injection  40 mg Subcutaneous Q24H  . latanoprost  1 drop Both Eyes QHS  . levothyroxine  88 mcg Oral QAC breakfast  . lisinopril  10 mg Oral Daily      Allergies: No Known Allergies  Social History   Social History  . Marital status: Widowed    Spouse name: N/A  . Number of children: N/A  . Years of education: N/A   Occupational History  . Not on file.   Social History Main Topics   . Smoking status: Never Smoker  . Smokeless tobacco: Never Used  . Alcohol use No  . Drug use: No  . Sexual activity: Not on file   Other Topics Concern  . Not on file   Social History Narrative  . No narrative on file     Family History  Problem Relation Age of Onset  . Breast cancer Mother     Died age 57  . Heart attack Father 102  . Breast cancer Sister      Review of Systems:no bleeding issues. No syncope.  All other systems reviewed and are otherwise negative except as noted above.  Labs:  Recent Labs  04/30/16 1115  TROPONINI <0.03   Lab Results  Component Value Date   WBC 6.9 04/30/2016   HGB 13.5 04/30/2016   HCT 40.3 04/30/2016   MCV 98.3 04/30/2016   PLT 198 04/30/2016    Recent Labs Lab 04/30/16 1115  NA 141  K 4.0  CL 108  CO2 27  BUN 15  CREATININE 0.74  CALCIUM 8.8*  PROT 6.7  BILITOT 0.6  ALKPHOS 84  ALT 14  AST 21  GLUCOSE 149*   Lab Results  Component Value Date   CHOL 204 (H) 05/01/2016   HDL 45 05/01/2016   LDLCALC 143 (H) 05/01/2016   TRIG 80 05/01/2016   No results found for: DDIMER  Radiology/Studies:  Mr Brain Wo Contrast (neuro Protocol)  Result Date: 04/30/2016 CLINICAL DATA:  Right-sided weakness and numbness.  Dizziness. EXAM: MRI HEAD WITHOUT CONTRAST TECHNIQUE: Multiplanar, multiecho pulse sequences of the brain and surrounding structures were obtained without intravenous contrast. COMPARISON:  MRI 06/05/2006 FINDINGS: Generalized atrophy with mild progression from the prior study. Negative for hydrocephalus. Multiple small areas of restricted diffusion compatible with acute infarction. Small area of acute infarct left cerebellar vermis Small infarct left lateral thalamus Small acute infarct high right parietal lobe. Moderate chronic microvascular ischemic changes are seen throughout the cerebral white matter. Mild chronic ischemic change in the pons. Small chronic infarct right cerebellum. Negative for hemorrhage or  mass lesion. No shift of the midline structures. Extensive mucosal edema throughout the paranasal sinuses. Bilateral lens extraction. No orbital mass lesion. Pituitary not enlarged. IMPRESSION: Small subcentimeter areas of acute infarct in the left cerebellar vermis, left lateral thalamus, and right high parietal lobe. Question small emboli. Atrophy and moderate chronic ischemic change Extensive mucosal thickening throughout the paranasal sinuses. Electronically Signed   By: Marlan Palau M.D.   On: 04/30/2016 12:26  US Carotid Bilateral  Result Date: 05/01/2016 CLINICAL DATA:  82 year old female with right-sided numbness and dizziness as well as a punctate left cerebellar infarct EXAM: BILATERAL CAROTID DUPLEX ULTRASOUND TECHNIQUE: Wallace Cullens scale imaging, color Doppler and duplex ultrasound were performed of bilateral carotid and vertebral arteries in the neck. COMPARISON:  Brain MRI/ MRA 04/30/2016 FINDINGS: Criteria: Quantification of carotid stenosis is  based on velocity parameters that correlate the residual internal carotid diameter with NASCET-based stenosis levels, using the diameter of the distal internal carotid lumen as the denominator for stenosis measurement. The following velocity measurements were obtained: RIGHT ICA:  70/19 cm/sec CCA:  72/12 cm/sec SYSTOLIC ICA/CCA RATIO:  1.0 DIASTOLIC ICA/CCA RATIO:  1.5 ECA:  71 cm/sec LEFT ICA:  63/17 cm/sec CCA:  68/10 cm/sec SYSTOLIC ICA/CCA RATIO:  0.9 DIASTOLIC ICA/CCA RATIO:  1.7 ECA:  56 cm/sec RIGHT CAROTID ARTERY: No significant atherosclerotic plaque or evidence of stenosis. RIGHT VERTEBRAL ARTERY:  Patent with normal antegrade flow. LEFT CAROTID ARTERY: No significant atherosclerotic plaque or evidence of stenosis. LEFT VERTEBRAL ARTERY:  Patent with normal antegrade flow. IMPRESSION: Negative bilateral carotid duplex ultrasound. No significant atherosclerotic plaque or evidence of stenosis. Vertebral arteries are patent with antegrade flow. Signed,  Sterling Big, MD [facility] Electronically Signed   By: Malachy Moan M.D.   On: 05/01/2016 10:22  Mr Maxine Glenn Head/brain IF Cm  Result Date: 04/30/2016 CLINICAL DATA:  Right-sided numbness and dizziness. Punctate cerebellar infarct. Abnormal MRI the brain. EXAM: MRA HEAD WITHOUT CONTRAST TECHNIQUE: Angiographic images of the Circle of Willis were obtained using MRA technique without intravenous contrast. COMPARISON:  MRI brain from the same day. FINDINGS: Mild tortuosity is present in the cervical right internal carotid artery. The internal carotid arteries are otherwise within normal limits from the high cervical segments through the ICA termini bilaterally. The A1 and M1 segments are intact. There is no definite anterior communicating artery. The MCA bifurcations are intact. Mild distal segmental narrowing is present in the MCA bifurcations bilaterally. The left vertebral artery and is hypoplastic and terminates at the PICA. The right vertebral artery feeds the basilar artery peer left posterior cerebral artery originates from the basilar artery with contribution from a small left posterior communicating artery. The right posterior cerebral artery is of fetal type. Signal loss the posterior cerebral arteries is felt to be artifactual. IMPRESSION: 1. Hypoplastic left vertebral artery without definite significant proximal stenosis, aneurysm, or branch vessel occlusion. Mild distal small vessel disease within the anterior and posterior circulation. 2. Mild distal small vessel disease within the anterior and posterior circulation. Electronically Signed   By: Marin Roberts M.D.   On: 04/30/2016 21:36   Wt Readings from Last 3 Encounters:  04/30/16 130 lb 11.7 oz (59.3 kg)    EKG: NSR 86bpm no acute ST-T changes  Physical Exam: Blood pressure (!) 164/78, pulse 80, temperature 97.9 F (36.6 C), temperature source Oral, resp. rate 16,  height 5\' 6"  (1.676 m), weight 130 lb 11.7 oz (59.3 kg), SpO2 98 %. Body mass index is 21.1 kg/m. General: Well developed, well nourished thin WF, in no acute distress. Head: Normocephalic, atraumatic, sclera non-icteric, no xanthomas, nares are without discharge.  Neck: Negative for carotid bruits. JVD not elevated. Lungs: Clear bilaterally to auscultation without wheezes, rales, or rhonchi. Breathing is unlabored. Heart: RRR with S1 S2. No murmurs, rubs, or gallops appreciated. Abdomen: Soft, non-tender, non-distended with normoactive bowel sounds. No hepatomegaly. No rebound/guarding. No obvious abdominal masses. Msk:  Strength and tone appear normal for age. Extremities: No clubbing or cyanosis. No edema.  Distal pedal pulses are 2+ and equal bilaterally. Neuro: Alert and oriented X 3. No facial asymmetry. No focal deficit. Moves all extremities spontaneously. Psych:  Responds to questions appropriately with a normal affect.    Assessment and Plan   1. Suspected embolic CVA - no  evidence of AF on telemetry thus far. She is quite viable and I believe she would be a candidate for anticoagulation if embolic source of her stroke was identified. Await 2D echocardiogram. If this is negative for clot, would recommend outpatient TEE/EP consultation to implant loop recorder. [facility] - the patient would come in for her TEE and endoscopy would contact the EP service to notify them of her arrival. If TEE is negative for clot, then EP would see the patient to discuss ILR. If felt appropriate, she would undergo ILR implantation in the same day. I will send a message to our [facility] coordinator, Daun Peacockrish Trent, who handles these requests to help tentatively arrange. Risks/benefits/alternatives of TEE discussed with the patient who is in agreement.  2. HTN - further management per neurology given typical recommendations for permissive hypertension.  3. Hyperlipidemia - started on  atorvastatin given uncontrolled LDL. Recommend recheck 6-8 weeks (can be done by PCP).  Signed, Laurann Montanaayna N Dunn PA-C 05/01/2016, 10:52 AM Pager: [phone]  The patient was seen and examined, and I agree with the history, physical exam, assessment and plan as documented above which has been discussed with D. Dunn. Pt admitted with CVA. Denies chest pain, palpitations, and exertional dyspnea. 2-D echo pending. As stated above, we will arrange for outpatient TEE and implantable loop recorder. She will be contacted.  She is in agreement with this plan.  Prentice DockerSuresh Koneswaran, MD, [facility]  05/01/2016 12:01 PM

## 2016-05-01 NOTE — Consult Note (Signed)
[place] A. Merlene Laughter, MD     [email or web address]          Nancy [name] is an 82 y.o. female.   ASSESSMENT/PLAN: 1. Multiple small bony lateral infarcts worrisome for embolic phenomena particularly cardioembolic phenomena. Risk factors are age and hypertension. A 30 day event monitor is suggested. Otherwise, the patient should continue on aspirin which was started on admission. We will follow the echocardiography. Physical therapy.   2. Baseline moderate gait impairment multifactorial including age, reduced vision and osteoarthritis.        The patient 83 year old right-handed white female who present with the acute onset of right leg numbness and gait ataxia. The patient developed the symptoms about 2 nights ago but did not see medical attention onto the following day. She reports her symptoms worsening and decided to contact her primary care provider who referred her to the emergency room. She did report having some numbness of the right hand but the symptoms were mostly limited to the right lower extremity. She reports worsening gait ataxia over baseline. She denies any dysarthria, dysphagia, chest pain or shortness of breath. She denies any palpitation. She denies symptoms of the left side. The review systems is otherwise negative.      GENERAL: This is a pleasant thin female in no acute distress.  HEENT: Normal.  ABDOMEN: soft  EXTREMITIES: No edema   BACK: Normal  SKIN: Normal by inspection.    MENTAL STATUS: Alert and oriented. Speech, language and cognition are generally intact. Judgment and insight normal.   CRANIAL NERVES: Pupils are equal, round and reactive to light and accomodation; extra ocular movements are full, there is no significant nystagmus; visual fields are full; upper and lower facial muscles are normal in strength and symmetric, there is no flattening of the nasolabial folds; tongue is midline; uvula is midline; shoulder  elevation is normal.  MOTOR: Normal tone, bulk and strength; no pronator drift. No drift of the upper extremities. She does have mild the right leg drift.  COORDINATION: Left finger to nose is normal, right finger to nose is normal, No rest tremor; no intention tremor; no postural tremor; no bradykinesia.  REFLEXES: Deep tendon reflexes are symmetrical and normal. Babinski reflexes are flexor bilaterally.   SENSATION: Normal to light touch and temperature. There is no extinction visually or tactilely on double simultaneous ablation.        Blood pressure (!) 162/75, pulse 84, temperature 97.8 F (36.6 C), temperature source Oral, resp. rate 18, height 5' 6" (1.676 m), weight 130 lb 11.7 oz (59.3 kg), SpO2 99 %.  Past Medical History:  Diagnosis Date  . DVT (deep venous thrombosis) ([place])    a. left lower extremity 07/2015.  . Glaucoma   . Hypertension   . Hypothyroidism   . Legally blind     Past Surgical History:  Procedure Laterality Date  . NASAL SINUS SURGERY    . SPINE SURGERY      Family History  Problem Relation Age of Onset  . Breast cancer Mother     Died age 71  . Heart attack Father 85  . Breast cancer Sister     Social History:  reports that she has never smoked. She has never used smokeless tobacco. She reports that she does not drink alcohol or use drugs.  Allergies: No Known Allergies  Medications: Prior to Admission medications   Medication Sig Start Date End Date Taking? Authorizing Provider  budesonide (PULMICORT) 0.5 MG/2ML nebulizer solution  Take 0.5 mg by nebulization 2 (two) times daily.   Yes Historical Provider, MD  Cholecalciferol (VITAMIN D-3) 1000 UNITS CAPS Take 1,000 Units by mouth daily.    Yes Historical Provider, MD  dorzolamide (TRUSOPT) 2 % ophthalmic solution Place 1 drop into both eyes 3 (three) times daily.    Yes Historical Provider, MD  latanoprost (XALATAN) 0.005 % ophthalmic solution Place 1 drop into both eyes at bedtime.     Yes Historical Provider, MD  levothyroxine (SYNTHROID, LEVOTHROID) 88 MCG tablet Take 88 mcg by mouth daily before breakfast.   Yes Historical Provider, MD  lisinopril (PRINIVIL,ZESTRIL) 10 MG tablet Take 10 mg by mouth daily.   Yes Historical Provider, MD  DiphenhydrAMINE HCl (ALLERGY MED PO) Take 1 capsule by mouth daily as needed (insomnia,allergies).     Historical Provider, MD    Scheduled Meds: .  stroke: mapping our early stages of recovery book   Does not apply Once  . aspirin  300 mg Rectal Daily   Or  . aspirin  325 mg Oral Daily  . atorvastatin  20 mg Oral q1800  . cholecalciferol  1,000 Units Oral Daily  . dorzolamide  1 drop Both Eyes TID  . enoxaparin (LOVENOX) injection  40 mg Subcutaneous Q24H  . latanoprost  1 drop Both Eyes QHS  . levothyroxine  88 mcg Oral QAC breakfast  . lisinopril  10 mg Oral Daily   Continuous Infusions:  PRN Meds:.acetaminophen **OR** acetaminophen, senna-docusate     Results for orders placed or performed during the hospital encounter of 04/30/16 (from the past 48 hour(s))  CBG monitoring, ED     Status: Abnormal   Collection Time: 04/30/16 10:47 AM  Result Value Ref Range   Glucose-Capillary 162 (H) 65 - 99 mg/dL  Ethanol     Status: None   Collection Time: 04/30/16 11:15 AM  Result Value Ref Range   Alcohol, Ethyl (B) <5 <5 mg/dL    Comment:        LOWEST DETECTABLE LIMIT FOR SERUM ALCOHOL IS 5 mg/dL FOR MEDICAL PURPOSES ONLY   Protime-INR     Status: None   Collection Time: 04/30/16 11:15 AM  Result Value Ref Range   Prothrombin Time 14.4 11.4 - 15.2 seconds   INR 1.10   APTT     Status: None   Collection Time: 04/30/16 11:15 AM  Result Value Ref Range   aPTT 26 24 - 36 seconds  CBC     Status: None   Collection Time: 04/30/16 11:15 AM  Result Value Ref Range   WBC 6.9 4.0 - 10.5 K/uL   RBC 4.10 3.87 - 5.11 MIL/uL   Hemoglobin 13.5 12.0 - 15.0 g/dL   HCT 40.3 36.0 - 46.0 %   MCV 98.3 78.0 - 100.0 fL   MCH 32.9 26.0  - 34.0 pg   MCHC 33.5 30.0 - 36.0 g/dL   RDW 13.3 11.5 - 15.5 %   Platelets 198 150 - 400 K/uL  Differential     Status: None   Collection Time: 04/30/16 11:15 AM  Result Value Ref Range   Neutrophils Relative % 70 %   Neutro Abs 4.8 1.7 - 7.7 K/uL   Lymphocytes Relative 20 %   Lymphs Abs 1.3 0.7 - 4.0 K/uL   Monocytes Relative 6 %   Monocytes Absolute 0.4 0.1 - 1.0 K/uL   Eosinophils Relative 3 %   Eosinophils Absolute 0.2 0.0 - 0.7 K/uL   Basophils Relative 1 %  Basophils Absolute 0.1 0.0 - 0.1 K/uL  Comprehensive metabolic panel     Status: Abnormal   Collection Time: 04/30/16 11:15 AM  Result Value Ref Range   Sodium 141 135 - 145 mmol/L   Potassium 4.0 3.5 - 5.1 mmol/L   Chloride 108 101 - 111 mmol/L   CO2 27 22 - 32 mmol/L   Glucose, Bld 149 (H) 65 - 99 mg/dL   BUN 15 6 - 20 mg/dL   Creatinine, Ser 0.74 0.44 - 1.00 mg/dL   Calcium 8.8 (L) 8.9 - 10.3 mg/dL   Total Protein 6.7 6.5 - 8.1 g/dL   Albumin 3.6 3.5 - 5.0 g/dL   AST 21 15 - 41 U/L   ALT 14 14 - 54 U/L   Alkaline Phosphatase 84 38 - 126 U/L   Total Bilirubin 0.6 0.3 - 1.2 mg/dL   GFR calc non Af Amer >60 >60 mL/min   GFR calc Af Amer >60 >60 mL/min    Comment: (NOTE) The eGFR has been calculated using the CKD EPI equation. This calculation has not been validated in all clinical situations. eGFR's persistently <60 mL/min signify possible Chronic Kidney Disease.    Anion gap 6 5 - 15  Troponin I     Status: None   Collection Time: 04/30/16 11:15 AM  Result Value Ref Range   Troponin I <0.03 <0.03 ng/mL  Urine rapid drug screen (hosp performed)not at [facility]     Status: None   Collection Time: 04/30/16  1:05 PM  Result Value Ref Range   Opiates NONE DETECTED NONE DETECTED   Cocaine NONE DETECTED NONE DETECTED   Benzodiazepines NONE DETECTED NONE DETECTED   Amphetamines NONE DETECTED NONE DETECTED   Tetrahydrocannabinol NONE DETECTED NONE DETECTED   Barbiturates NONE DETECTED NONE DETECTED    Comment:         DRUG SCREEN FOR MEDICAL PURPOSES ONLY.  IF CONFIRMATION IS NEEDED FOR ANY PURPOSE, NOTIFY LAB WITHIN 5 DAYS.        LOWEST DETECTABLE LIMITS FOR URINE DRUG SCREEN Drug Class       Cutoff (ng/mL) Amphetamine      1000 Barbiturate      200 Benzodiazepine   021 Tricyclics       117 Opiates          300 Cocaine          300 THC              50   Urinalysis, Routine w reflex microscopic (not at [facility])     Status: Abnormal   Collection Time: 04/30/16  1:05 PM  Result Value Ref Range   Color, Urine STRAW (A) YELLOW   APPearance CLEAR CLEAR   Specific Gravity, Urine 1.010 1.005 - 1.030   pH 6.5 5.0 - 8.0   Glucose, UA NEGATIVE NEGATIVE mg/dL   Hgb urine dipstick NEGATIVE NEGATIVE   Bilirubin Urine NEGATIVE NEGATIVE   Ketones, ur NEGATIVE NEGATIVE mg/dL   Protein, ur NEGATIVE NEGATIVE mg/dL   Nitrite NEGATIVE NEGATIVE   Leukocytes, UA TRACE (A) NEGATIVE  Urine microscopic-add on     Status: Abnormal   Collection Time: 04/30/16  1:05 PM  Result Value Ref Range   Squamous Epithelial / LPF 0-5 (A) NONE SEEN   WBC, UA 0-5 0 - 5 WBC/hpf   RBC / HPF NONE SEEN 0 - 5 RBC/hpf   Bacteria, UA RARE (A) NONE SEEN  Lipid panel     Status: Abnormal  Collection Time: 05/01/16  4:47 AM  Result Value Ref Range   Cholesterol 204 (H) 0 - 200 mg/dL   Triglycerides 80 <150 mg/dL   HDL 45 >40 mg/dL   Total CHOL/HDL Ratio 4.5 RATIO   VLDL 16 0 - 40 mg/dL   LDL Cholesterol 143 (H) 0 - 99 mg/dL    Comment:        Total Cholesterol/HDL:CHD Risk Coronary Heart Disease Risk Table                     Men   Women  1/2 Average Risk   3.4   3.3  Average Risk       5.0   4.4  2 X Average Risk   9.6   7.1  3 X Average Risk  23.4   11.0        Use the calculated Patient Ratio above and the CHD Risk Table to determine the patient's CHD Risk.        ATP III CLASSIFICATION (LDL):  <100     mg/dL   Optimal  100-129  mg/dL   Near or Above                    Optimal  130-159  mg/dL   Borderline   160-189  mg/dL   High  >190     mg/dL   Very High     Studies/Results:  BRAIN MRA FINDINGS: Mild tortuosity is present in the cervical right internal carotid artery. The internal carotid arteries are otherwise within normal limits from the high cervical segments through the ICA termini bilaterally. The A1 and M1 segments are intact. There is no definite anterior communicating artery. The MCA bifurcations are intact. Mild distal segmental narrowing is present in the MCA bifurcations bilaterally. The left vertebral artery and is hypoplastic and terminates at the PICA. The right vertebral artery feeds the basilar artery peer left posterior cerebral artery originates from the basilar artery with contribution from a small left posterior communicating artery. The right posterior cerebral artery is of fetal type. Signal loss the posterior cerebral arteries is felt to be artifactual. IMPRESSION: 1. Hypoplastic left vertebral artery without definite significant proximal stenosis, aneurysm, or branch vessel occlusion. Mild distal small vessel disease within the anterior and posterior circulation. 2. Mild distal small vessel disease within the anterior and posterior circulation.   BRAIN MRI Generalized atrophy with mild progression from the prior study. Negative for hydrocephalus. Multiple small areas of restricted diffusion compatible with acute infarction. Small area of acute infarct left cerebellar vermis Small infarct left lateral thalamus Small acute infarct high right parietal lobe. Moderate chronic microvascular ischemic changes are seen throughout the cerebral white matter. Mild chronic ischemic change in the pons. Small chronic infarct right cerebellum. Negative for hemorrhage or mass lesion. No shift of the midline structures. Extensive mucosal edema throughout the paranasal sinuses. Bilateral lens extraction. No orbital mass lesion. Pituitary not  enlarged. IMPRESSION: Small subcentimeter areas of acute infarct in the left cerebellar vermis, left lateral thalamus, and right high parietal lobe. Question small emboli. Atrophy and moderate chronic ischemic change Extensive mucosal thickening throughout the paranasal sinuses.   CAROTID DOPPLERS unremarkable  Hend Mccarrell A. Merlene Laughter, M.D.  Diplomate, Tax adviser of Psychiatry and Neurology ( Neurology). 05/01/2016, 3:51 PM

## 2016-05-01 NOTE — Progress Notes (Signed)
Subjective: She notes mild right leg weakness this morning. She feels diminished balance upon standing.  Objective: Vital signs in last 24 hours: Vitals:   04/30/16 2004 04/30/16 2321 05/01/16 0121 05/01/16 0321  BP:  (!) 148/82 (!) 138/56 (!) 123/53  Pulse:  78 79 80  Resp:  18 15 15   Temp:  98 F (36.7 C) 98.5 F (36.9 C) 98.3 F (36.8 C)  TempSrc:  Oral Oral Oral  SpO2: 98% 99% 98% 98%  Weight:      Height:       Weight change:   Intake/Output Summary (Last 24 hours) at 05/01/16 0742 Last data filed at 04/30/16 1851  Gross per 24 hour  Intake              240 ml  Output                0 ml  Net              240 ml    Physical Exam: Alert and oriented. Speech intact. No carotid bruits. Lungs clear. Heart regular with no murmurs. Abdomen nontender with no hepatosplenomegaly. Extremities reveal no edema. Neuro exam reveals no weakness in the upper extremities. There is mild proximal weakness in the right leg. Face is symmetric.  Lab Results:    Results for orders placed or performed during the hospital encounter of 04/30/16 (from the past 24 hour(s))  CBG monitoring, ED     Status: Abnormal   Collection Time: 04/30/16 10:47 AM  Result Value Ref Range   Glucose-Capillary 162 (H) 65 - 99 mg/dL  Ethanol     Status: None   Collection Time: 04/30/16 11:15 AM  Result Value Ref Range   Alcohol, Ethyl (B) <5 <5 mg/dL  Protime-INR     Status: None   Collection Time: 04/30/16 11:15 AM  Result Value Ref Range   Prothrombin Time 14.4 11.4 - 15.2 seconds   INR 1.10   APTT     Status: None   Collection Time: 04/30/16 11:15 AM  Result Value Ref Range   aPTT 26 24 - 36 seconds  CBC     Status: None   Collection Time: 04/30/16 11:15 AM  Result Value Ref Range   WBC 6.9 4.0 - 10.5 K/uL   RBC 4.10 3.87 - 5.11 MIL/uL   Hemoglobin 13.5 12.0 - 15.0 g/dL   HCT 16.1 09.6 - 04.5 %   MCV 98.3 78.0 - 100.0 fL   MCH 32.9 26.0 - 34.0 pg   MCHC 33.5 30.0 - 36.0 g/dL   RDW 40.9 81.1 -  91.4 %   Platelets 198 150 - 400 K/uL  Differential     Status: None   Collection Time: 04/30/16 11:15 AM  Result Value Ref Range   Neutrophils Relative % 70 %   Neutro Abs 4.8 1.7 - 7.7 K/uL   Lymphocytes Relative 20 %   Lymphs Abs 1.3 0.7 - 4.0 K/uL   Monocytes Relative 6 %   Monocytes Absolute 0.4 0.1 - 1.0 K/uL   Eosinophils Relative 3 %   Eosinophils Absolute 0.2 0.0 - 0.7 K/uL   Basophils Relative 1 %   Basophils Absolute 0.1 0.0 - 0.1 K/uL  Comprehensive metabolic panel     Status: Abnormal   Collection Time: 04/30/16 11:15 AM  Result Value Ref Range   Sodium 141 135 - 145 mmol/L   Potassium 4.0 3.5 - 5.1 mmol/L   Chloride 108 101 - 111  mmol/L   CO2 27 22 - 32 mmol/L   Glucose, Bld 149 (H) 65 - 99 mg/dL   BUN 15 6 - 20 mg/dL   Creatinine, Ser 1.61 0.44 - 1.00 mg/dL   Calcium 8.8 (L) 8.9 - 10.3 mg/dL   Total Protein 6.7 6.5 - 8.1 g/dL   Albumin 3.6 3.5 - 5.0 g/dL   AST 21 15 - 41 U/L   ALT 14 14 - 54 U/L   Alkaline Phosphatase 84 38 - 126 U/L   Total Bilirubin 0.6 0.3 - 1.2 mg/dL   GFR calc non Af Amer >60 >60 mL/min   GFR calc Af Amer >60 >60 mL/min   Anion gap 6 5 - 15  Troponin I     Status: None   Collection Time: 04/30/16 11:15 AM  Result Value Ref Range   Troponin I <0.03 <0.03 ng/mL  Urine rapid drug screen (hosp performed)not at [facility]     Status: None   Collection Time: 04/30/16  1:05 PM  Result Value Ref Range   Opiates NONE DETECTED NONE DETECTED   Cocaine NONE DETECTED NONE DETECTED   Benzodiazepines NONE DETECTED NONE DETECTED   Amphetamines NONE DETECTED NONE DETECTED   Tetrahydrocannabinol NONE DETECTED NONE DETECTED   Barbiturates NONE DETECTED NONE DETECTED  Urinalysis, Routine w reflex microscopic (not at [facility])     Status: Abnormal   Collection Time: 04/30/16  1:05 PM  Result Value Ref Range   Color, Urine STRAW (A) YELLOW   APPearance CLEAR CLEAR   Specific Gravity, Urine 1.010 1.005 - 1.030   pH 6.5 5.0 - 8.0   Glucose, UA NEGATIVE  NEGATIVE mg/dL   Hgb urine dipstick NEGATIVE NEGATIVE   Bilirubin Urine NEGATIVE NEGATIVE   Ketones, ur NEGATIVE NEGATIVE mg/dL   Protein, ur NEGATIVE NEGATIVE mg/dL   Nitrite NEGATIVE NEGATIVE   Leukocytes, UA TRACE (A) NEGATIVE  Urine microscopic-add on     Status: Abnormal   Collection Time: 04/30/16  1:05 PM  Result Value Ref Range   Squamous Epithelial / LPF 0-5 (A) NONE SEEN   WBC, UA 0-5 0 - 5 WBC/hpf   RBC / HPF NONE SEEN 0 - 5 RBC/hpf   Bacteria, UA RARE (A) NONE SEEN  Lipid panel     Status: Abnormal   Collection Time: 05/01/16  4:47 AM  Result Value Ref Range   Cholesterol 204 (H) 0 - 200 mg/dL   Triglycerides 80 <096 mg/dL   HDL 45 >04 mg/dL   Total CHOL/HDL Ratio 4.5 RATIO   VLDL 16 0 - 40 mg/dL   LDL Cholesterol 540 (H) 0 - 99 mg/dL     ABGS No results for input(s): PHART, PO2ART, TCO2, HCO3 in the last 72 hours.  Invalid input(s): PCO2 CULTURES No results found for this or any previous visit (from the past 240 hour(s)). Studies/Results: Mr Brain Wo Contrast (neuro Protocol)  Result Date: 04/30/2016 CLINICAL DATA:  Right-sided weakness and numbness.  Dizziness. EXAM: MRI HEAD WITHOUT CONTRAST TECHNIQUE: Multiplanar, multiecho pulse sequences of the brain and surrounding structures were obtained without intravenous contrast. COMPARISON:  MRI 06/05/2006 FINDINGS: Generalized atrophy with mild progression from the prior study. Negative for hydrocephalus. Multiple small areas of restricted diffusion compatible with acute infarction. Small area of acute infarct left cerebellar vermis Small infarct left lateral thalamus Small acute infarct high right parietal lobe. Moderate chronic microvascular ischemic changes are seen throughout the cerebral white matter. Mild chronic ischemic change in the pons. Small chronic infarct  right cerebellum. Negative for hemorrhage or mass lesion. No shift of the midline structures. Extensive mucosal edema throughout the paranasal sinuses.  Bilateral lens extraction. No orbital mass lesion. Pituitary not enlarged. IMPRESSION: Small subcentimeter areas of acute infarct in the left cerebellar vermis, left lateral thalamus, and right high parietal lobe. Question small emboli. Atrophy and moderate chronic ischemic change Extensive mucosal thickening throughout the paranasal sinuses. Electronically Signed   By: Marlan Palau M.D.   On: 04/30/2016 12:26  Mr Maxine Glenn Head/brain DG Cm  Result Date: 04/30/2016 CLINICAL DATA:  Right-sided numbness and dizziness. Punctate cerebellar infarct. Abnormal MRI the brain. EXAM: MRA HEAD WITHOUT CONTRAST TECHNIQUE: Angiographic images of the Circle of Willis were obtained using MRA technique without intravenous contrast. COMPARISON:  MRI brain from the same day. FINDINGS: Mild tortuosity is present in the cervical right internal carotid artery. The internal carotid arteries are otherwise within normal limits from the high cervical segments through the ICA termini bilaterally. The A1 and M1 segments are intact. There is no definite anterior communicating artery. The MCA bifurcations are intact. Mild distal segmental narrowing is present in the MCA bifurcations bilaterally. The left vertebral artery and is hypoplastic and terminates at the PICA. The right vertebral artery feeds the basilar artery peer left posterior cerebral artery originates from the basilar artery with contribution from a small left posterior communicating artery. The right posterior cerebral artery is of fetal type. Signal loss the posterior cerebral arteries is felt to be artifactual. IMPRESSION: 1. Hypoplastic left vertebral artery without definite significant proximal stenosis, aneurysm, or branch vessel occlusion. Mild distal small vessel disease within the anterior and posterior circulation. 2. Mild distal small vessel disease within the anterior and posterior circulation. Electronically Signed   By: Marin Roberts M.D.   On: 04/30/2016  21:36  Micro Results: No results found for this or any previous visit (from the past 240 hour(s)). Studies/Results: Mr Brain Wo Contrast (neuro Protocol)  Result Date: 04/30/2016 CLINICAL DATA:  Right-sided weakness and numbness.  Dizziness. EXAM: MRI HEAD WITHOUT CONTRAST TECHNIQUE: Multiplanar, multiecho pulse sequences of the brain and surrounding structures were obtained without intravenous contrast. COMPARISON:  MRI 06/05/2006 FINDINGS: Generalized atrophy with mild progression from the prior study. Negative for hydrocephalus. Multiple small areas of restricted diffusion compatible with acute infarction. Small area of acute infarct left cerebellar vermis Small infarct left lateral thalamus Small acute infarct high right parietal lobe. Moderate chronic microvascular ischemic changes are seen throughout the cerebral white matter. Mild chronic ischemic change in the pons. Small chronic infarct right cerebellum. Negative for hemorrhage or mass lesion. No shift of the midline structures. Extensive mucosal edema throughout the paranasal sinuses. Bilateral lens extraction. No orbital mass lesion. Pituitary not enlarged. IMPRESSION: Small subcentimeter areas of acute infarct in the left cerebellar vermis, left lateral thalamus, and right high parietal lobe. Question small emboli. Atrophy and moderate chronic ischemic change Extensive mucosal thickening throughout the paranasal sinuses. Electronically Signed   By: Marlan Palau M.D.   On: 04/30/2016 12:26  Mr Maxine Glenn Head/brain LO Cm  Result Date: 04/30/2016 CLINICAL DATA:  Right-sided numbness and dizziness. Punctate cerebellar infarct. Abnormal MRI the brain. EXAM: MRA HEAD WITHOUT CONTRAST TECHNIQUE: Angiographic images of the Circle of Willis were obtained using MRA technique without intravenous contrast. COMPARISON:  MRI brain from the same day. FINDINGS: Mild tortuosity is present in the cervical right internal carotid artery. The internal carotid arteries  are otherwise within normal limits from the high cervical segments through the ICA  termini bilaterally. The A1 and M1 segments are intact. There is no definite anterior communicating artery. The MCA bifurcations are intact. Mild distal segmental narrowing is present in the MCA bifurcations bilaterally. The left vertebral artery and is hypoplastic and terminates at the PICA. The right vertebral artery feeds the basilar artery peer left posterior cerebral artery originates from the basilar artery with contribution from a small left posterior communicating artery. The right posterior cerebral artery is of fetal type. Signal loss the posterior cerebral arteries is felt to be artifactual. IMPRESSION: 1. Hypoplastic left vertebral artery without definite significant proximal stenosis, aneurysm, or branch vessel occlusion. Mild distal small vessel disease within the anterior and posterior circulation. 2. Mild distal small vessel disease within the anterior and posterior circulation. Electronically Signed   By: Marin Roberts M.D.   On: 04/30/2016 21:36  Medications:  I have reviewed the patient's current medications Scheduled Meds: .  stroke: mapping our early stages of recovery book   Does not apply Once  . aspirin  300 mg Rectal Daily   Or  . aspirin  325 mg Oral Daily  . cholecalciferol  1,000 Units Oral Daily  . dorzolamide  1 drop Both Eyes TID  . latanoprost  1 drop Both Eyes QHS  . levothyroxine  88 mcg Oral QAC breakfast  . lisinopril  10 mg Oral Daily   Continuous Infusions:  PRN Meds:.acetaminophen **OR** acetaminophen, senna-docusate   Assessment/Plan: #1. Probable embolic stroke. Echo and carotid ultrasound pending. Consult neurology and cardiology. EKG reveals sinus rhythm. #2. Hypertension. Continue lisinopril. #3. Hyperlipidemia. LDL 143.  Physical therapy consultation also today. Active Problems:   Stroke [facility])   Right sided weakness   Benign essential HTN    Hypothyroidism   Pressure ulcer     LOS: 1 day   Smith Mcnicholas 05/01/2016, 7:42 AM

## 2016-05-02 LAB — HEMOGLOBIN A1C
Hgb A1c MFr Bld: 5.3 % (ref 4.8–5.6)
Mean Plasma Glucose: 105 mg/dL

## 2016-05-02 MED ORDER — ATORVASTATIN CALCIUM 20 MG PO TABS: 20.0000 mg | ORAL_TABLET | Freq: Every day | ORAL | 12 refills | Status: DC

## 2016-05-02 MED ORDER — ASPIRIN 325 MG PO TABS: 325.0000 mg | ORAL_TABLET | Freq: Every day | ORAL | 12 refills | Status: DC

## 2016-05-02 NOTE — Progress Notes (Signed)
Patient discharge to home. IV removed. Discharge instruction given and went through with family.  Taken downstairs via wheelchair.

## 2016-05-02 NOTE — Care Management Important Message (Signed)
Important Message  Patient Details  Name: Nancy [name] MRN: [id number] Date of Birth: December 25, 1924   Medicare Important Message Given:  Yes    Kimiah Hibner, Chrystine Oiler, RN 05/02/2016, 9:55 AM

## 2016-05-02 NOTE — Discharge Summary (Signed)
Physician Discharge Summary  Nancy [name] UVO:[id number] DOB: 04/16/1925 DOA: 04/30/2016   Admit date: 04/30/2016 Discharge date: 05/02/2016  Discharge Diagnoses:  Active Problems:   Stroke [facility])   Right sided weakness   Essential hypertension   Hypothyroidism   Pressure ulcer   Hyperglycemia   Hyperlipidemia    Wt Readings from Last 3 Encounters:  04/30/16 130 lb 11.7 oz (59.3 kg)     Hospital Course:  This patient is an 82 year old female who presented with right leg numbness and weakness. An MRI of the brain revealed small acute infarcts in the left cerebellum, left thalamus and right parietal lobe. She was in normal sinus rhythm. She maintained sinus rhythm throughout her hospitalization. She is treated for hypertension with lisinopril. Atorvastatin was started for hyperlipidemia. LDL was 143. She does not smoke. A carotid ultrasound revealed no stenosis. An MRA of the head revealed no significant stenoses. Her echocardiogram revealed normal left ventricular function with no wall motion abnormalities. She was seen in consultation by neurology. Aspirin was added. She was seen in consultation by cardiology. Arrangements are being made for trans-esophageal echocardiogram and possible implanted loop recorder next week.  She was evaluated and treated with physical therapy. She has gait disturbance at baseline and feels more imbalance since her strokes. She is able to walk with a walker and assistance. She will have home health physical therapy.  She has a small sacral decubitus which has been treated with a barrier pad.  Her condition at discharge is improved. Lungs clear. Heart regular with no murmurs. No carotid bruits. She has normal strength in her lower extremities. Gait appears at baseline.  She'll be seen in follow-up in my office in one week.   Discharge Instructions     Medication List    TAKE these medications   ALLERGY MED PO Take 1 capsule by mouth daily as needed  (insomnia,allergies).   aspirin 325 MG tablet Take 1 tablet (325 mg total) by mouth daily.   atorvastatin 20 MG tablet Commonly known as:  LIPITOR Take 1 tablet (20 mg total) by mouth daily at 6 PM.   budesonide 0.5 MG/2ML nebulizer solution Commonly known as:  PULMICORT Take 0.5 mg by nebulization 2 (two) times daily.   dorzolamide 2 % ophthalmic solution Commonly known as:  TRUSOPT Place 1 drop into both eyes 3 (three) times daily.   latanoprost 0.005 % ophthalmic solution Commonly known as:  XALATAN Place 1 drop into both eyes at bedtime.   levothyroxine 88 MCG tablet Commonly known as:  SYNTHROID, LEVOTHROID Take 88 mcg by mouth daily before breakfast.   lisinopril 10 MG tablet Commonly known as:  PRINIVIL,ZESTRIL Take 10 mg by mouth daily.   Vitamin D-3 1000 units Caps Take 1,000 Units by mouth daily.        Nancy [name] 05/02/2016

## 2016-05-08 ENCOUNTER — Encounter: Payer: Self-pay | Admitting: Physician Assistant

## 2016-05-08 NOTE — Progress Notes (Signed)
This note stands to document in the chart the chain of notes regarding this patient's arrangement of TEE/possible loop.  Laurann Montana, PA-C  Karl Pock Pinnix, LPN  Cc: Dewain Penning; Virgel Gess Goins        Hi Kisha,  I got a call from [facility] just a short while ago asking about the plan for TEE/loop on this patient. The patient and Dr. Ouida Sills have called our office inquiring about it. As below, I sent a message to Trish to help arrange this. She is out today and tomorrow so I can't ask her what her plans were for this. But she usually allows the patient to go home and recover for several days before scheduling this so I suspect it's still in the works. I've forwarded this to her for her input when she gets back.   In the meantime, if the patient is feeling well, then you can go ahead and schedule an outpatient TEE for stroke - HAS TO BE AT [facility] Please let endoscopy know to page the EP APP/doctor when the patient arrives to endoscopy so they can consult regarding possible loop recorder implantation. Also, please send a staff message to the EP team working in the hospital that day to give them a heads up. As below I am happy to write her TEE orders when this is scheduled, just let me know.   Syretta Kochel   Previous Messages    ----- Message -----  From: Dewain Penning  Sent: 05/01/2016 11:36 AM  To: Laurann Montana, PA-C  Subject: RE: TEE/loop - [facility] patient         Ok, I will take care of it and let you know.   Thanks  Bethann Berkshire   ----- Message -----  From: Laurann Montana, PA-C  Sent: 05/01/2016 11:27 AM  To: Dewain Penning  Subject: TEE/loop - [facility] patient           Hi Trish,  This is a patient that [place] and I have seen today for stroke up at [facility]. She looks great and will probably go home tomorrow pending PT eval. She is having a 2D echo today. If it is negative for clot, she will need to be set up for outpatient TEE/loop eval at [facility]. She is 85 but a very  viable 82 year old. Can you help arrange? If you want to let me know who will do the procedure, I can put in orders when it's scheduled (if I am working that day).  Thanks!  Dameshia Seybold

## 2016-05-12 ENCOUNTER — Ambulatory Visit (HOSPITAL_COMMUNITY): Payer: Medicare Other | Attending: Internal Medicine | Admitting: Physical Therapy

## 2016-05-12 DIAGNOSIS — M6281 Muscle weakness (generalized): Secondary | ICD-10-CM | POA: Insufficient documentation

## 2016-05-12 DIAGNOSIS — R262 Difficulty in walking, not elsewhere classified: Secondary | ICD-10-CM | POA: Insufficient documentation

## 2016-05-12 DIAGNOSIS — R2681 Unsteadiness on feet: Secondary | ICD-10-CM

## 2016-05-12 NOTE — Therapy (Signed)
[facility] [address] [place], [place], [address] Phone: [phone]   Fax:  [phone]  Physical Therapy Evaluation  Patient Details  Name: Nancy [name] MRN: [id number] Date of Birth: [birth year] Referring Provider: Carylon Perchesoy Fagan   Encounter Date: 05/12/2016      PT End of Session - 05/12/16 1230    Visit Number 1   Number of Visits 18   Date for PT Re-Evaluation 06/02/16   Authorization Type UHC Medicare    Authorization Time Period 05/12/16 to 06/23/16   Authorization - Visit Number 1   Authorization - Number of Visits 10   PT Start Time 1032   PT Stop Time 1111   PT Time Calculation (min) 39 min   Activity Tolerance Patient tolerated treatment well   Behavior [facility] Therapy [facility] for tasks assessed/performed      Past Medical History:  Diagnosis Date  . DVT (deep venous thrombosis) (HCC)    a. left lower extremity 07/2015.  . Glaucoma   . Hypertension   . Hypothyroidism   . Legally blind     Past Surgical History:  Procedure Laterality Date  . NASAL SINUS SURGERY    . SPINE SURGERY      There were no vitals filed for this visit.       Subjective Assessment - 05/12/16 1038    Subjective Patient had a stroke around 04/30/16, she received therapy while she was in the hospital. She has noticed that her balance is off, however this has been this way for several years now; no falls or close calls recently. She was using her rollator before the stroke. The hardest thing for her to do right now is standing still without holding onto anything, anything related to her balance. She states she feels like she is wobbling back and forth a bit just sitting unsupported.    Pertinent History HTN, CVA, hypothyroidism, hx of DVT, glaucoma, legally blind, multiple surgeries for a ruptured disc and a bulging disc    How long can you sit comfortably? 8/7- unlimited    How long can you stand comfortably? 8/7- usually leans on something but  unlimited    How long can you walk comfortably? 8/7- fatigues quickly, unable to identify specific time    Patient Stated Goals to get stronger, walk better, get balance better    Currently in Pain? No/denies            [facility] - 05/12/16 0001      Assessment   Medical Diagnosis CVA   Referring Provider Carylon Perchesoy Fagan    Onset Date/Surgical Date 04/30/16   Next MD Visit Dr. Ouida SillsFagan August 29th      Precautions   Precautions Fall     Balance Screen   Has the patient fallen in the past 6 months No   Has the patient had a decrease in activity level because of a fear of falling?  No   Is the patient reluctant to leave their home because of a fear of falling?  No     Prior Function   Level of Independence Independent;Independent with basic ADLs;Independent with gait;Independent with transfers   Vocation Retired   Leisure none      Strength   Overall Strength Comments trunk strength approx 4-4+/5   Right Hip Flexion 4+/5   Right Hip Extension 2/5   Right Hip ABduction 3/5   Left Hip Flexion 4+/5   Left Hip Extension 2/5  Left Hip ABduction 3/5   Right Knee Flexion 4/5   Right Knee Extension 4-/5   Left Knee Flexion 4/5   Left Knee Extension 4-/5   Right Ankle Dorsiflexion 5/5   Left Ankle Dorsiflexion 5/5     Ambulation/Gait   Gait Comments proximal muscle weakness, reduced gait speed      6 minute walk test results    Aerobic Endurance Distance Walked 477   Endurance additional comments with rollator      Berg Balance Test   Sit to Stand Able to stand without using hands and stabilize independently   Standing Unsupported Able to stand safely 2 minutes   Sitting with Back Unsupported but Feet Supported on Floor or Stool Able to sit safely and securely 2 minutes   Stand to Sit Sits safely with minimal use of hands   Transfers Able to transfer safely, minor use of hands   Standing Unsupported with Eyes Closed Able to stand 10 seconds with supervision    Standing Ubsupported with Feet Together Able to place feet together independently and stand for 1 minute with supervision   From Standing, Reach Forward with Outstretched Arm Can reach forward >12 cm safely (5")   From Standing Position, Pick up Object from Floor Able to pick up shoe, needs supervision   From Standing Position, Turn to Look Behind Over each Shoulder Turn sideways only but maintains balance   Turn 360 Degrees Able to turn 360 degrees safely but slowly   Standing Unsupported, Alternately Place Feet on Step/Stool Able to complete 4 steps without aid or supervision   Standing Unsupported, One Foot in [employer] balance while stepping or standing   Standing on One Leg Unable to try or needs assist to prevent fall   Total Score 38                           PT Education - 05/12/16 1230    Education provided Yes   Education Details POC, prognosis, HEP to be given next session    Person(s) Educated Patient   Methods Explanation   Comprehension Verbalized understanding          PT Short Term Goals - 05/12/16 1233      PT SHORT TERM GOAL #1   Title Patient to be able to ambulate 771ft during with rollator in order to demonstrate improved mobilty and functional activity tolerance    Time 3   Period Weeks   Status New     PT SHORT TERM GOAL #2   Title Patient to be able to identify 5/5 safety items for use at home and community in order to demonstrate improved safety awareness/reduced fall risk    Time 3   Period Weeks   Status New     PT SHORT TERM GOAL #3   Title Patient to score at least 44 on the BERG in order to demonstrate overall reduced fall risk    Time 3   Period Weeks   Status New     PT SHORT TERM GOAL #4   Title Patient to be able to consistently and correctly perform appropriate HEP, to be updated PRN    Time 3   Period Weeks   Status New           PT Long Term Goals - 05/12/16 1236      PT LONG TERM GOAL #1   Title  Patient to score  MMT 5/5 in all tested groups in order to demonstrate improved strength for functional mobility based tasks    Time 6   Period Weeks   Status New     PT LONG TERM GOAL #2   Title Patient to score at least a 50 on BERG balance test in order to demonstrate reduced overall fall risk    Time 6   Period Weeks   Status New     PT LONG TERM GOAL #3   Title Patient to be able to safely ascend/descend at least 4 stairs with U railing, good motor control, and minimal unsteadiness in order to improve overall mobility    Time 6   Period Weeks   Status New     PT LONG TERM GOAL #4   Title Patient to be participatory in regular exercise, at least 20 minutes at a time, at least 4 days per week, in order to maintain functional gains and improve overall health status    Time 6   Period Weeks   Status New               Plan - 05-20-2016 1231    Clinical Impression Statement Patient arrives status post CVA that occurred on 04/30/16; she states that right now she is most concerned about her balance as she has noticed this deteriorating for several years but especially after her stroke. Upon examination, patient reveals significant functional muscle weakness, impaired functional balance skills, gait and postural impairment, and reduced functional activity tolerance. Per BERG balance score she does remain a significant fall risk at this time. Patient will benefit from skilled PT services in order to address functional limitations, reduce overall fall risk, and to reach optimal level of function.    Rehab Potential Good   PT Frequency 3x / week   PT Duration 6 weeks   PT Treatment/Interventions ADLs/Self Care Home Management;Biofeedback;DME Instruction;Gait training;Stair training;Functional mobility training;Therapeutic activities;Therapeutic exercise;Balance training;Neuromuscular re-education;Patient/family education;Manual techniques;Passive range of motion;Energy conservation;Taping    PT Next Visit Plan ASSIGN HEP (see below); funtional strength, balance, functional activity tolerance training. Focus on CKC. Try nustep. DO NOT EXPAND HEP PAST 4-5 EXERCISES TOTAL.    PT Home Exercise Plan HEP: TANDEM STANCE, SUPINE HIP ABD, BRIDGE.    Consulted and Agree with Plan of Care Patient      Patient will benefit from skilled therapeutic intervention in order to improve the following deficits and impairments:  Abnormal gait, Improper body mechanics, Postural dysfunction, Decreased activity tolerance, Decreased strength, Decreased balance, Decreased safety awareness, Difficulty walking, Impaired flexibility  Visit Diagnosis: Muscle weakness (generalized) - Plan: PT plan of care cert/re-cert  Unsteadiness on feet - Plan: PT plan of care cert/re-cert  Difficulty in walking, not elsewhere classified - Plan: PT plan of care cert/re-cert      G-Codes - 05/20/16 1240    Functional Assessment Tool Used Based on skilled clinical assessment of strength, gait, balance    Functional Limitation Mobility: Walking and moving around   Mobility: Walking and Moving Around Current Status (Z6109) At least 40 percent but less than 60 percent impaired, limited or restricted   Mobility: Walking and Moving Around Goal Status (U0454) At least 20 percent but less than 40 percent impaired, limited or restricted       Problem List Patient Active Problem List   Diagnosis Date Noted  . Pressure ulcer 05/01/2016  . Hyperglycemia 05/01/2016  . Hyperlipidemia 05/01/2016  . Stroke (HCC) 04/30/2016  . Right sided weakness 04/30/2016  .  Essential hypertension 04/30/2016  . Hypothyroidism 04/30/2016    Nedra Hai PT, DPT [phone]  [facility] [address] [place], [place], [address] Phone: [phone]   Fax:  [phone]  Name: Nancy [name] MRN: [id number] Date of Birth: 07-22-1925

## 2016-05-13 ENCOUNTER — Encounter: Payer: Self-pay | Admitting: *Deleted

## 2016-05-13 ENCOUNTER — Telehealth: Payer: Self-pay | Admitting: *Deleted

## 2016-05-13 ENCOUNTER — Other Ambulatory Visit: Payer: Self-pay | Admitting: Physician Assistant

## 2016-05-13 NOTE — Telephone Encounter (Signed)
Patient scheduled for TEE/loop on Aug. 15 @ 0800. Case number # [id number]. Patient and daughter notified. Instructions to be mailed to patient.

## 2016-05-14 ENCOUNTER — Ambulatory Visit (HOSPITAL_COMMUNITY): Payer: Medicare Other | Admitting: Physical Therapy

## 2016-05-14 DIAGNOSIS — R2681 Unsteadiness on feet: Secondary | ICD-10-CM

## 2016-05-14 DIAGNOSIS — M6281 Muscle weakness (generalized): Secondary | ICD-10-CM

## 2016-05-14 DIAGNOSIS — R262 Difficulty in walking, not elsewhere classified: Secondary | ICD-10-CM

## 2016-05-14 NOTE — Therapy (Signed)
[facility] [address] [place], [place], [address] Phone: [phone]   Fax:  [phone]  Physical Therapy Treatment  Patient Details  Name: Nancy [name] MRN: [id number] Date of Birth: 1925/02/04 Referring Provider: Carylon Perches   Encounter Date: 05/14/2016      PT End of Session - 05/14/16 1747    Visit Number 2   Number of Visits 18   Date for PT Re-Evaluation 06/02/16   Authorization Type UHC Medicare    Authorization Time Period 05/12/16 to 06/23/16   Authorization - Visit Number 2   Authorization - Number of Visits 10   PT Start Time 1650   PT Stop Time 1740   PT Time Calculation (min) 50 min   Activity Tolerance Patient tolerated treatment well   Behavior During Therapy [facility] for tasks assessed/performed      Past Medical History:  Diagnosis Date  . DVT (deep venous thrombosis) (HCC)    a. left lower extremity 07/2015.  . Glaucoma   . Hypertension   . Hypothyroidism   . Legally blind     Past Surgical History:  Procedure Laterality Date  . NASAL SINUS SURGERY    . SPINE SURGERY      There were no vitals filed for this visit.      Subjective Assessment - 05/14/16 1658    Subjective Pt states she is not having any pain or issues at home. STates she was not given a HEP.    Currently in Pain? No/denies                         [facility] Adult PT Treatment/Exercise - 05/14/16 0001      Knee/Hip Exercises: Standing   Heel Raises Both;10 reps   SLS bilaterally with 1 fingertip 30" each   Other Standing Knee Exercises tandem gait 30" with 1 fingertip hold     Knee/Hip Exercises: Supine   Bridges 10 reps   Straight Leg Raises 10 reps     Knee/Hip Exercises: Sidelying   Hip ABduction 10 reps     Knee/Hip Exercises: Prone   Hip Extension 10 reps                PT Education - 05/14/16 1718    Education provided Yes   Education Details reviewe of intial evaluation and goals.  HEP given,  exercise instruction and bed mobilty.   Person(s) Educated Patient   Methods Explanation;Demonstration;Tactile cues;Verbal cues;Handout   Comprehension Verbalized understanding;Returned demonstration;Verbal cues required;Tactile cues required;Need further instruction          PT Short Term Goals - 05/12/16 1233      PT SHORT TERM GOAL #1   Title Patient to be able to ambulate 752ft during with rollator in order to demonstrate improved mobilty and functional activity tolerance    Time 3   Period Weeks   Status New     PT SHORT TERM GOAL #2   Title Patient to be able to identify 5/5 safety items for use at home and community in order to demonstrate improved safety awareness/reduced fall risk    Time 3   Period Weeks   Status New     PT SHORT TERM GOAL #3   Title Patient to score at least 44 on the BERG in order to demonstrate overall reduced fall risk    Time 3   Period Weeks   Status New  PT SHORT TERM GOAL #4   Title Patient to be able to consistently and correctly perform appropriate HEP, to be updated PRN    Time 3   Period Weeks   Status New           PT Long Term Goals - 05/12/16 1236      PT LONG TERM GOAL #1   Title Patient to score MMT 5/5 in all tested groups in order to demonstrate improved strength for functional mobility based tasks    Time 6   Period Weeks   Status New     PT LONG TERM GOAL #2   Title Patient to score at least a 50 on BERG balance test in order to demonstrate reduced overall fall risk    Time 6   Period Weeks   Status New     PT LONG TERM GOAL #3   Title Patient to be able to safely ascend/descend at least 4 stairs with U railing, good motor control, and minimal unsteadiness in order to improve overall mobility    Time 6   Period Weeks   Status New     PT LONG TERM GOAL #4   Title Patient to be participatory in regular exercise, at least 20 minutes at a time, at least 4 days per week, in order to maintain functional  gains and improve overall health status    Time 6   Period Weeks   Status New               Plan - 05/14/16 1748    Clinical Impression Statement Reviewed initial evaluation including longterm and shortterm goals.  initiated therex and given a writeen HEP to complete 1X daily.  PT able to complete all exercise without complaints.  Prone hip extension appears to be most difficult for patient due to weakness. Able to achieve SLS for 30 seconds with 1 fingertip hold as well as tandem gait. PT having TEE procedure done on 8/15 for her heart and had to cancel on that day.     Rehab Potential Good   PT Frequency 3x / week   PT Duration 6 weeks   PT Treatment/Interventions ADLs/Self Care Home Management;Biofeedback;DME Instruction;Gait training;Stair training;Functional mobility training;Therapeutic activities;Therapeutic exercise;Balance training;Neuromuscular re-education;Patient/family education;Manual techniques;Passive range of motion;Energy conservation;Taping   PT Next Visit Plan Continue to progress functional strength, balance, functional activity tolerance training. Focus on CKC. Try nustep.   PT Home Exercise Plan assigned to patient including TANDEM STANCE, SLS, SUPINE BRIDGE, sidelying hip abduction and prone hip extension    Consulted and Agree with Plan of Care Patient      Patient will benefit from skilled therapeutic intervention in order to improve the following deficits and impairments:  Abnormal gait, Improper body mechanics, Postural dysfunction, Decreased activity tolerance, Decreased strength, Decreased balance, Decreased safety awareness, Difficulty walking, Impaired flexibility  Visit Diagnosis: Muscle weakness (generalized)  Unsteadiness on feet  Difficulty in walking, not elsewhere classified     Problem List Patient Active Problem List   Diagnosis Date Noted  . Pressure ulcer 05/01/2016  . Hyperglycemia 05/01/2016  . Hyperlipidemia 05/01/2016  . Stroke  (HCC) 04/30/2016  . Right sided weakness 04/30/2016  . Essential hypertension 04/30/2016  . Hypothyroidism 04/30/2016    Lurena Nidamy B Frazier, PTA/CLT [phone]  05/14/2016, 6:01 PM  [facility] [address] [place], [place], [address] Phone: [phone]   Fax:  [phone]  Name: Nancy [name] MRN: [id number] Date of  Birth: 14-Jun-1925

## 2016-05-14 NOTE — Patient Instructions (Addendum)
Bridge    Lie back, legs bent. Inhale, pressing hips up. Keeping ribs in, lengthen lower back. Exhale, rolling down along spine from top. Repeat __10__ times. Do _1-_2__ sessions per day.     Abduction: Side-Lying    Lie on side. Lift top leg slightly higher than shoulder level. Keep top leg straight with body, toes pointing forward. Slowly lower for 3-5 seconds. _10__ reps per set, __1-2_ sets per day  HIP / KNEE: Extension - Prone    Squeeze glutes. Raise leg up. Keep knee straight. _10__ reps per set, _1-2__ sets per day    Single Leg - Eyes Open  [facility]    Holding support, lift right leg while maintaining balance over other leg. Progress to removing hands from support surface for longer periods of time. Hold_30___ seconds.   .  Tandem Stance  DO AT SINK    Right foot in front of left, heel touching toe both feet "straight ahead". Stand on Foot Triangle of Support with both feet. Balance in this position __30_ seconds. Do with left foot in front of right.

## 2016-05-15 ENCOUNTER — Ambulatory Visit (HOSPITAL_COMMUNITY): Payer: Medicare Other | Admitting: Physical Therapy

## 2016-05-15 DIAGNOSIS — R262 Difficulty in walking, not elsewhere classified: Secondary | ICD-10-CM

## 2016-05-15 DIAGNOSIS — R2681 Unsteadiness on feet: Secondary | ICD-10-CM

## 2016-05-15 DIAGNOSIS — M6281 Muscle weakness (generalized): Secondary | ICD-10-CM

## 2016-05-15 NOTE — Therapy (Signed)
[facility] [address] [place], [place], [address] Phone: [phone]   Fax:  [phone]  Physical Therapy Treatment  Patient Details  Name: Nancy [name] MRN: [id number] Date of Birth: [birth year] Referring Provider: Carylon Perchesoy Fagan   Encounter Date: 05/15/2016      PT End of Session - 05/15/16 1427    Visit Number 3   Number of Visits 18   Date for PT Re-Evaluation 06/02/16   Authorization Type [employer] Medicare    Authorization Time Period 05/12/16 to 06/23/16   Authorization - Visit Number 3   Authorization - Number of Visits 10   PT Start Time 1345   PT Stop Time 1425   PT Time Calculation (min) 40 min   Equipment Utilized During Treatment Gait belt   Activity Tolerance Patient tolerated treatment well   Behavior During Therapy [facility] for tasks assessed/performed      Past Medical History:  Diagnosis Date  . DVT (deep venous thrombosis) (HCC)    a. left lower extremity 07/2015.  . Glaucoma   . Hypertension   . Hypothyroidism   . Legally blind     Past Surgical History:  Procedure Laterality Date  . NASAL SINUS SURGERY    . SPINE SURGERY      There were no vitals filed for this visit.      Subjective Assessment - 05/15/16 1346    Subjective Patient arrives stating she felt fine after after last session, no soreness. She is doing well, no falls or close calls.    Pertinent History HTN, CVA, hypothyroidism, hx of DVT, glaucoma, legally blind, multiple surgeries for a ruptured disc and a bulging disc    Currently in Pain? No/denies                         [facility] Adult PT Treatment/Exercise - 05/15/16 0001      Ambulation/Gait   Ambulation/Gait Yes   Ambulation/Gait Assistance 4: Min guard   Ambulation Distance (Feet) 50 Feet   Assistive device Small based quad cane   Gait Comments unsteadiness noted, cues for sequcing/min guard for safety      Knee/Hip Exercises: Aerobic   Nustep level 5, hills 0, x5  minutes   not included in billing      Knee/Hip Exercises: Standing   Heel Raises Both;1 set;15 reps   Heel Raises Limitations heel and toe raises    Forward Lunges Both;1 set;10 reps   Forward Lunges Limitations 4 inch box    Forward Step Up Both;1 set;10 reps   Forward Step Up Limitations 4 inch box      Knee/Hip Exercises: Seated   Other Seated Knee/Hip Exercises cone rotations with feet elevated, lateral trunk flexion      Knee/Hip Exercises: Supine   Bridges 10 reps;2 sets   Other Supine Knee/Hip Exercises supine hip ABD with red TB 2x10   Other Supine Knee/Hip Exercises quadruped to high kneel, seated squats 1x10     Knee/Hip Exercises: Prone   Hip Extension 10 reps                PT Education - 05/15/16 1427    Education provided No          PT Short Term Goals - 05/12/16 1233      PT SHORT TERM GOAL #1   Title Patient to be able to ambulate 74300ft during 3MWT with rollator in order to demonstrate improved  mobilty and functional activity tolerance    Time 3   Period Weeks   Status New     PT SHORT TERM GOAL #2   Title Patient to be able to identify 5/5 safety items for use at home and community in order to demonstrate improved safety awareness/reduced fall risk    Time 3   Period Weeks   Status New     PT SHORT TERM GOAL #3   Title Patient to score at least 44 on the BERG in order to demonstrate overall reduced fall risk    Time 3   Period Weeks   Status New     PT SHORT TERM GOAL #4   Title Patient to be able to consistently and correctly perform appropriate HEP, to be updated PRN    Time 3   Period Weeks   Status New           PT Long Term Goals - 05/12/16 1236      PT LONG TERM GOAL #1   Title Patient to score MMT 5/5 in all tested groups in order to demonstrate improved strength for functional mobility based tasks    Time 6   Period Weeks   Status New     PT LONG TERM GOAL #2   Title Patient to score at least a 50 on BERG  balance test in order to demonstrate reduced overall fall risk    Time 6   Period Weeks   Status New     PT LONG TERM GOAL #3   Title Patient to be able to safely ascend/descend at least 4 stairs with U railing, good motor control, and minimal unsteadiness in order to improve overall mobility    Time 6   Period Weeks   Status New     PT LONG TERM GOAL #4   Title Patient to be participatory in regular exercise, at least 20 minutes at a time, at least 4 days per week, in order to maintain functional gains and improve overall health status    Time 6   Period Weeks   Status New               Plan - 05/15/16 1428    Clinical Impression Statement Continued with functional strengthening in supine, prone, sitting and standing, also incorporated functional coordination exercises such as seated cone rotations, then progressed to standing exercise and balance training tasks. Patient very pleasant this session and did very well today overall. Trailed gait with SBQC today, with unsteadiness noted partially due to vision impairment and min guard provided throughout. Ended session with unbilled time on Nustep for functional strength and muscle coordination.    Rehab Potential Good   PT Frequency 3x / week   PT Duration 6 weeks   PT Treatment/Interventions ADLs/Self Care Home Management;Biofeedback;DME Instruction;Gait training;Stair training;Functional mobility training;Therapeutic activities;Therapeutic exercise;Balance training;Neuromuscular re-education;Patient/family education;Manual techniques;Passive range of motion;Energy conservation;Taping   PT Next Visit Plan Continue to progress functional strength, balance, functional activity tolerance training. Focus on CKC. Continue nustep.   Consulted and Agree with Plan of Care Patient      Patient will benefit from skilled therapeutic intervention in order to improve the following deficits and impairments:  Abnormal gait, Improper body  mechanics, Postural dysfunction, Decreased activity tolerance, Decreased strength, Decreased balance, Decreased safety awareness, Difficulty walking, Impaired flexibility  Visit Diagnosis: Muscle weakness (generalized)  Unsteadiness on feet  Difficulty in walking, not elsewhere classified     Problem List Patient  Active Problem List   Diagnosis Date Noted  . Pressure ulcer 05/01/2016  . Hyperglycemia 05/01/2016  . Hyperlipidemia 05/01/2016  . Stroke (HCC) 04/30/2016  . Right sided weakness 04/30/2016  . Essential hypertension 04/30/2016  . Hypothyroidism 04/30/2016    Nedra Hai PT, DPT [phone]  [facility] [address] [place], [place], [address] Phone: [phone]   Fax:  [phone]  Name: Nancy [name] MRN: [id number] Date of Birth: [birth year]

## 2016-05-20 ENCOUNTER — Ambulatory Visit (HOSPITAL_COMMUNITY): Payer: Medicare Other | Admitting: Physical Therapy

## 2016-05-20 ENCOUNTER — Encounter (HOSPITAL_COMMUNITY)
Admission: RE | Disposition: A | Discharge status: Home / Self Care | Payer: Self-pay | Source: Ambulatory Visit | Attending: Cardiovascular Disease

## 2016-05-20 ENCOUNTER — Ambulatory Visit (HOSPITAL_COMMUNITY)
Admission: RE | Admit: 2016-05-20 | Discharge: 2016-05-20 | Disposition: A | Discharge status: Home / Self Care | Payer: Medicare Other | Source: Ambulatory Visit | Attending: Cardiovascular Disease | Admitting: Cardiovascular Disease

## 2016-05-20 ENCOUNTER — Encounter (HOSPITAL_COMMUNITY): Payer: Self-pay

## 2016-05-20 ENCOUNTER — Ambulatory Visit (HOSPITAL_BASED_OUTPATIENT_CLINIC_OR_DEPARTMENT_OTHER): Payer: Medicare Other

## 2016-05-20 DIAGNOSIS — E785 Hyperlipidemia, unspecified: Secondary | ICD-10-CM | POA: Diagnosis not present

## 2016-05-20 DIAGNOSIS — I351 Nonrheumatic aortic (valve) insufficiency: Secondary | ICD-10-CM

## 2016-05-20 DIAGNOSIS — E039 Hypothyroidism, unspecified: Secondary | ICD-10-CM | POA: Diagnosis not present

## 2016-05-20 DIAGNOSIS — I639 Cerebral infarction, unspecified: Secondary | ICD-10-CM

## 2016-05-20 DIAGNOSIS — Z86718 Personal history of other venous thrombosis and embolism: Secondary | ICD-10-CM | POA: Diagnosis not present

## 2016-05-20 DIAGNOSIS — Z8673 Personal history of transient ischemic attack (TIA), and cerebral infarction without residual deficits: Secondary | ICD-10-CM | POA: Insufficient documentation

## 2016-05-20 DIAGNOSIS — H548 Legal blindness, as defined in USA: Secondary | ICD-10-CM | POA: Insufficient documentation

## 2016-05-20 HISTORY — PX: EP IMPLANTABLE DEVICE: SHX172B

## 2016-05-20 HISTORY — PX: TEE WITHOUT CARDIOVERSION: SHX5443

## 2016-05-20 SURGERY — LOOP RECORDER INSERTION

## 2016-05-20 SURGERY — ECHOCARDIOGRAM, TRANSESOPHAGEAL
Anesthesia: Moderate Sedation

## 2016-05-20 MED ORDER — LIDOCAINE-EPINEPHRINE 1 %-1:100000 IJ SOLN
INTRAMUSCULAR | Status: AC
  Filled 2016-05-20: qty 1

## 2016-05-20 MED ORDER — FENTANYL CITRATE (PF) 100 MCG/2ML IJ SOLN
INTRAMUSCULAR | Status: AC
  Filled 2016-05-20: qty 2

## 2016-05-20 MED ORDER — BUTAMBEN-TETRACAINE-BENZOCAINE 2-2-14 % EX AERO
INHALATION_SPRAY | CUTANEOUS | Status: DC | PRN
  Administered 2016-05-20: 2 via TOPICAL

## 2016-05-20 MED ORDER — LIDOCAINE-EPINEPHRINE 1 %-1:100000 IJ SOLN
INTRAMUSCULAR | Status: DC | PRN
  Administered 2016-05-20: 30 mL

## 2016-05-20 MED ORDER — MIDAZOLAM HCL 10 MG/2ML IJ SOLN
INTRAMUSCULAR | Status: DC | PRN
  Administered 2016-05-20: 2 mg via INTRAVENOUS
  Administered 2016-05-20: 1 mg via INTRAVENOUS

## 2016-05-20 MED ORDER — FENTANYL CITRATE (PF) 100 MCG/2ML IJ SOLN
INTRAMUSCULAR | Status: DC | PRN
  Administered 2016-05-20: 12.5 ug via INTRAVENOUS
  Administered 2016-05-20: 25 ug via INTRAVENOUS

## 2016-05-20 MED ORDER — SODIUM CHLORIDE 0.9 % IV SOLN: INTRAVENOUS | Status: DC

## 2016-05-20 MED ORDER — MIDAZOLAM HCL 5 MG/ML IJ SOLN
INTRAMUSCULAR | Status: AC
  Filled 2016-05-20: qty 2

## 2016-05-20 SURGICAL SUPPLY — 2 items
LOOP REVEAL LINQSYS (Prosthesis & Implant Heart) ×2 IMPLANT
PACK LOOP INSERTION (CUSTOM PROCEDURE TRAY) ×3 IMPLANT

## 2016-05-20 NOTE — Consult Note (Signed)
ELECTROPHYSIOLOGY CONSULT NOTE  Patient ID: Nancy GranaJoyce L Lines MRN: [id number], DOB/AGE: 1925-03-18   Admit date: 05/20/2016 Date of Consult: 05/20/2016  Primary Physician: Carylon PerchesFAGAN,ROY, MD Primary Cardiologist: none Reason for Consultation: Cryptogenic stroke - Loop Recorder eval  History of Present Illness Nancy [name] was admitted on 04/30/16 with CVA.   Imaging demonstrated small acute infarcts in the left cerebellum, left thalamus and right parietal lobe.  she underwent workup during her admission for stroke including echocardiogram and carotid dopplers.  The patient discharge summary reports she was monitored on telemetry which demonstrated sinus rhythm with no arrhythmias.  She was then planned for out patient TEE with possible loop implant.   She comes in today for out patient TEE possible loop, feels well, no complaints.  05/01/16 Echocardiogram  Study Conclusions - Left ventricle: The cavity size was normal. Wall thickness was   increased in a pattern of mild LVH. Systolic function was normal.   The estimated ejection fraction was in the range of 60% to 65%.   Wall motion was normal; there were no regional wall motion   abnormalities. Doppler parameters are consistent with abnormal   left ventricular relaxation (grade 1 diastolic dysfunction).   Doppler parameters are consistent with high ventricular filling   pressure. - Aortic valve: Mildly to moderately calcified annulus. Trileaflet;   mildly thickened leaflets. There was mild regurgitation. - Mitral valve: Mildly calcified annulus. There was mild   regurgitation. - Right atrium: The atrium was mildly dilated. - Atrial septum: No defect or patent foramen ovale was identified. - Tricuspid valve: There was mild regurgitation.  The patient denies chest pain, shortness of breath, dizziness, palpitations, or syncope.   EP has been asked to evaluate for placement of an implantable loop recorder to monitor for atrial  fibrillation.     Past Medical History:  Diagnosis Date  . DVT (deep venous thrombosis) (HCC)    a. left lower extremity 07/2015.  . Glaucoma   . Hypertension   . Hypothyroidism   . Legally blind      Surgical History:  Past Surgical History:  Procedure Laterality Date  . NASAL SINUS SURGERY    . SPINE SURGERY       Prescriptions Prior to Admission  Medication Sig Dispense Refill Last Dose  . aspirin 325 MG tablet Take 1 tablet (325 mg total) by mouth daily. 30 tablet 12 05/19/2016 at Unknown time  . atorvastatin (LIPITOR) 20 MG tablet Take 1 tablet (20 mg total) by mouth daily at 6 PM. 30 tablet 12 05/20/2016 at Unknown time  . Cholecalciferol (VITAMIN D-3) 1000 UNITS CAPS Take 1,000 Units by mouth daily.    05/19/2016 at Unknown time  . dorzolamide (TRUSOPT) 2 % ophthalmic solution Place 1 drop into both eyes 3 (three) times daily.    05/19/2016 at Unknown time  . latanoprost (XALATAN) 0.005 % ophthalmic solution Place 1 drop into both eyes at bedtime.    05/19/2016 at Unknown time  . levothyroxine (SYNTHROID, LEVOTHROID) 88 MCG tablet Take 88 mcg by mouth daily before breakfast.   05/19/2016 at Unknown time  . lisinopril (PRINIVIL,ZESTRIL) 10 MG tablet Take 10 mg by mouth daily.   05/20/2016 at Unknown time  . budesonide (PULMICORT) 0.5 MG/2ML nebulizer solution Take 0.5 mg by nebulization 2 (two) times daily.   Unknown at Unknown time  . DiphenhydrAMINE HCl (ALLERGY MED PO) Take 1 capsule by mouth daily as needed (insomnia,allergies).    Unknown at Unknown time  Inpatient Medications:    Allergies: No Known Allergies  Social History   Social History  . Marital status: Widowed    Spouse name: N/A  . Number of children: N/A  . Years of education: N/A   Occupational History  . Not on file.   Social History Main Topics  . Smoking status: Never Smoker  . Smokeless tobacco: Never Used  . Alcohol use No  . Drug use: No  . Sexual activity: Not on file   Other Topics  Concern  . Not on file   Social History Narrative  . No narrative on file     Family History  Problem Relation Age of Onset  . Breast cancer Mother     Died age 73  . Heart attack Father 57  . Breast cancer Sister       Review of Systems: All other systems reviewed and are otherwise negative except as noted above.  Physical Exam: Vitals:   05/20/16 0658 05/20/16 0805 05/20/16 0810  BP: (!) 198/83 (!) 199/103 (!) 226/106  Pulse: 87 96 (!) 104  Resp: 12 13 13   TempSrc: Oral    SpO2: 96% 100% 100%  Weight: 130 lb (59 kg)    Height: 5\' 6"  (1.676 m)      GEN- The patient is well appearing, alert and oriented x 3 today.   Head- normocephalic, atraumatic Eyes-  Sclera clear, conjunctiva pink Ears- hearing intact Oropharynx- clear Neck- supple Lungs- Clear to ausculation bilaterally, normal work of breathing Heart- Regular rate and rhythm, no murmurs, rubs or gallops  GI- soft, NT, ND Extremities- no clubbing, cyanosis, or edema MS- no significant deformity or atrophy Skin- no rash or lesion Psych- euthymic mood, full affect   Labs:   Lab Results  Component Value Date   WBC 6.9 04/30/2016   HGB 13.5 04/30/2016   HCT 40.3 04/30/2016   MCV 98.3 04/30/2016   PLT 198 04/30/2016   No results for input(s): NA, K, CL, CO2, BUN, CREATININE, CALCIUM, PROT, BILITOT, ALKPHOS, ALT, AST, GLUCOSE in the last 168 hours.  Invalid input(s): LABALBU Lab Results  Component Value Date   TROPONINI <0.03 04/30/2016   Lab Results  Component Value Date   CHOL 204 (H) 05/01/2016   Lab Results  Component Value Date   HDL 45 05/01/2016   Lab Results  Component Value Date   LDLCALC 143 (H) 05/01/2016   Lab Results  Component Value Date   TRIG 80 05/01/2016   Lab Results  Component Value Date   CHOLHDL 4.5 05/01/2016   No results found for: LDLDIRECT  No results found for: DDIMER   Radiology/Studies:  Mr Brain Wo Contrast (neuro Protocol) Result Date:  04/30/2016 CLINICAL DATA:  Right-sided weakness and numbness.  Dizziness. EXAM: MRI HEAD WITHOUT CONTRAST TECHNIQUE: Multiplanar, multiecho pulse sequences of the brain and surrounding structures were obtained without intravenous contrast. COMPARISON:  MRI 06/05/2006 FINDINGS: Generalized atrophy with mild progression from the prior study. Negative for hydrocephalus. Multiple small areas of restricted diffusion compatible with acute infarction. Small area of acute infarct left cerebellar vermis Small infarct left lateral thalamus Small acute infarct high right parietal lobe. Moderate chronic microvascular ischemic changes are seen throughout the cerebral white matter. Mild chronic ischemic change in the pons. Small chronic infarct right cerebellum. Negative for hemorrhage or mass lesion. No shift of the midline structures. Extensive mucosal edema throughout the paranasal sinuses. Bilateral lens extraction. No orbital mass lesion. Pituitary not enlarged. IMPRESSION: Small subcentimeter areas  of acute infarct in the left cerebellar vermis, left lateral thalamus, and right high parietal lobe. Question small emboli. Atrophy and moderate chronic ischemic change Extensive mucosal thickening throughout the paranasal sinuses. Electronically Signed   By: Marlan Palau M.D.   On: 04/30/2016 12:26  US Carotid Bilateral Result Date: 05/01/2016 CLINICAL DATA:  82 year old female with right-sided numbness and dizziness as well as a punctate left cerebellar infarct EXAM: BILATERAL CAROTID DUPLEX ULTRASOUND TECHNIQUE: Wallace Cullens scale imaging, color Doppler and duplex ultrasound were performed of bilateral carotid and vertebral arteries in the neck. COMPARISON:  Brain MRI/ MRA 04/30/2016 FINDINGS: Criteria: Quantification of carotid stenosis is based on velocity parameters that correlate the residual internal carotid diameter with NASCET-based stenosis levels, using the diameter of the distal internal carotid lumen as the denominator  for stenosis measurement. The following velocity measurements were obtained: RIGHT ICA:  70/19 cm/sec CCA:  72/12 cm/sec SYSTOLIC ICA/CCA RATIO:  1.0 DIASTOLIC ICA/CCA RATIO:  1.5 ECA:  71 cm/sec LEFT ICA:  63/17 cm/sec CCA:  68/10 cm/sec SYSTOLIC ICA/CCA RATIO:  0.9 DIASTOLIC ICA/CCA RATIO:  1.7 ECA:  56 cm/sec RIGHT CAROTID ARTERY: No significant atherosclerotic plaque or evidence of stenosis. RIGHT VERTEBRAL ARTERY:  Patent with normal antegrade flow. LEFT CAROTID ARTERY: No significant atherosclerotic plaque or evidence of stenosis. LEFT VERTEBRAL ARTERY:  Patent with normal antegrade flow. IMPRESSION: Negative bilateral carotid duplex ultrasound. No significant atherosclerotic plaque or evidence of stenosis. Vertebral arteries are patent with antegrade flow. Signed, Sterling Big, MD [facility] Electronically Signed   By: Malachy Moan M.D.   On: 05/01/2016 10:22  Mr Maxine Glenn Head/brain NW Cm Result Date: 04/30/2016 CLINICAL DATA:  Right-sided numbness and dizziness. Punctate cerebellar infarct. Abnormal MRI the brain. EXAM: MRA HEAD WITHOUT CONTRAST TECHNIQUE: Angiographic images of the Circle of Willis were obtained using MRA technique without intravenous contrast. COMPARISON:  MRI brain from the same day. FINDINGS: Mild tortuosity is present in the cervical right internal carotid artery. The internal carotid arteries are otherwise within normal limits from the high cervical segments through the ICA termini bilaterally. The A1 and M1 segments are intact. There is no definite anterior communicating artery. The MCA bifurcations are intact. Mild distal segmental narrowing is present in the MCA bifurcations bilaterally. The left vertebral artery and is hypoplastic and terminates at the PICA. The right vertebral artery feeds the basilar artery peer left posterior cerebral artery originates from the basilar artery with contribution from a small  left posterior communicating artery. The right posterior cerebral artery is of fetal type. Signal loss the posterior cerebral arteries is felt to be artifactual. IMPRESSION: 1. Hypoplastic left vertebral artery without definite significant proximal stenosis, aneurysm, or branch vessel occlusion. Mild distal small vessel disease within the anterior and posterior circulation. 2. Mild distal small vessel disease within the anterior and posterior circulation. Electronically Signed   By: Marin Roberts M.D.   On: 04/30/2016 21:36   12-lead ECG SR All prior EKG's in EPIC reviewed with no documented atrial fibrillation  Telemetry: patient is an out patient, CV strips in epic are SR only  Assessment and Plan:  1. Cryptogenic stroke The patient presents for out patient TEE and possible loop secondary to cryptogenic stroke.  The patient has a TEE planned for this AM.  Dr. Linna Caprice spoke at length with the patient about monitoring for afib with either a 30 day event monitor or an implantable loop recorder.  Risks, benefits, and alteratives to implantable loop  recorder were discussed with the patient today.   At this time, the patient was very clear in their decision to proceed with implantable loop recorder.   Wound care was reviewed with the patient (keep incision clean and dry for 3 days).   Please call with questions.   Sheilah PigeonRenee Lynn Ursuy, PA-C 05/20/2016  I have seen and examined this patient with Francis Dowseenee Ursuy.  Agree with above, note added to reflect my findings.  On exam, regular rhythm, no murmurs, lungs clear.  Had CVA and presenting for TEE and loop. Risks and benefits of LINQ were discussed.  The patient and family agree with implantation.    Lamichael Youkhana M. Teoman Giraud MD 05/20/2016 11:43 AM

## 2016-05-20 NOTE — Progress Notes (Signed)
Echocardiogram Echocardiogram Transesophageal has been performed.  Dorothey BasemanReel, Fadel Clason M 05/20/2016, 8:38 AM

## 2016-05-20 NOTE — Discharge Instructions (Signed)
Please refer to the supplemental discharge sheet     This information is not intended to replace advice given to you by your health care provider. Make sure you discuss any questions you have with your health care provider.   Document Released: 07/01/2008 Document Revised: 10/13/2014 Document Reviewed: 03/21/2013 Elsevier Interactive Patient Education 2016 Elsevier Inc. Transesophageal Echocardiogram Transesophageal echocardiography (TEE) is a picture test of your heart using sound waves. The pictures taken can give very detailed pictures of your heart. This can help your doctor see if there are problems with your heart. TEE can check:  If your heart has blood clots in it.  How well your heart valves are working.  If you have an infection on the inside of your heart.  Some of the major arteries of your heart.  If your heart valve is working after a Psychologist, forensic.  Your heart before a procedure that uses a shock to your heart to get the rhythm back to normal. BEFORE THE PROCEDURE  Do not eat or drink for 6 hours before the procedure or as told by your doctor.  Make plans to have someone drive you home after the procedure. Do not drive yourself home.  An IV tube will be put in your arm. PROCEDURE  You will be given a medicine to help you relax (sedative). It will be given through the IV tube.  A numbing medicine will be sprayed or gargled in the back of your throat to help numb it.  The tip of the probe is placed into the back of your mouth. You will be asked to swallow. This helps to pass the probe into your esophagus.  Once the tip of the probe is in the right place, your doctor can take pictures of your heart.  You may feel pressure at the back of your throat. AFTER THE PROCEDURE  You will be taken to a recovery area so the sedative can wear off.  Your throat may be sore and scratchy. This will go away slowly over time.  You will go home when you are fully awake and able to  swallow liquids.  You should have someone stay with you for the next 24 hours.  Do not drive or operate machinery for the next 24 hours.   This information is not intended to replace advice given to you by your health care provider. Make sure you discuss any questions you have with your health care provider.   Document Released: 07/20/2009 Document Revised: 09/27/2013 Document Reviewed: 03/24/2013 Elsevier Interactive Patient Education 2016 Elsevier Inc.    Moderate Conscious Sedation, Adult, Care After Refer to this sheet in the next few weeks. These instructions provide you with information on caring for yourself after your procedure. Your health care provider may also give you more specific instructions. Your treatment has been planned according to current medical practices, but problems sometimes occur. Call your health care provider if you have any problems or questions after your procedure. WHAT TO EXPECT AFTER THE PROCEDURE  After your procedure:  You may feel sleepy, clumsy, and have poor balance for several hours.  Vomiting may occur if you eat too soon after the procedure. HOME CARE INSTRUCTIONS  Do not participate in any activities where you could become injured for at least 24 hours. Do not:  Drive.  Swim.  Ride a bicycle.  Operate heavy machinery.  Cook.  Use power tools.  Climb ladders.  Work from a high place.  Do not make important  decisions or sign legal documents until you are improved.  If you vomit, drink water, juice, or soup when you can drink without vomiting. Make sure you have little or no nausea before eating solid foods.  Only take over-the-counter or prescription medicines for pain, discomfort, or fever as directed by your health care provider.  Make sure you and your family fully understand everything about the medicines given to you, including what side effects may occur.  You should not drink alcohol, take sleeping pills, or take  medicines that cause drowsiness for at least 24 hours.  If you smoke, do not smoke without supervision.  If you are feeling better, you may resume normal activities 24 hours after you were sedated.  Keep all appointments with your health care provider. SEEK MEDICAL CARE IF:  Your skin is pale or bluish in color.  You continue to feel nauseous or vomit.  Your pain is getting worse and is not helped by medicine.  You have bleeding or swelling.  You are still sleepy or feeling clumsy after 24 hours. SEEK IMMEDIATE MEDICAL CARE IF:  You develop a rash.  You have difficulty breathing.  You develop any type of allergic problem.  You have a fever. MAKE SURE YOU:  Understand these instructions.  Will watch your condition.  Will get help right away if you are not doing well or get worse.   This information is not intended to replace advice given to you by your health care provider. Make sure you discuss any questions you have with your health care provider.   Document Released: 07/13/2013 Document Revised: 10/13/2014 Document Reviewed: 07/13/2013 Elsevier Interactive Patient Education 2016 Elsevier Inc.    Cardiac Event Monitoring A cardiac event monitor is a small recording device used to help detect abnormal heart rhythms (arrhythmias). The monitor is used to record heart rhythm when noticeable symptoms such as the following occur:  Fast heartbeats (palpitations), such as heart racing or fluttering.  Dizziness.  Fainting or light-headedness.  Unexplained weakness. The monitor is wired to two electrodes placed on your chest. Electrodes are flat, sticky disks that attach to your skin. The monitor can be worn for up to 30 days. You will wear the monitor at all times, except when bathing.  HOW TO USE YOUR CARDIAC EVENT MONITOR A technician will prepare your chest for the electrode placement. The technician will show you how to place the electrodes, how to work the monitor,  and how to replace the batteries. Take time to practice using the monitor before you leave the office. Make sure you understand how to send the information from the monitor to your health care provider. This requires a telephone with a landline, not a cell phone. You need to:  Wear your monitor at all times, except when you are in water:  Do not get the monitor wet.  Take the monitor off when bathing. Do not swim or use a hot tub with it on.  Keep your skin clean. Do not put body lotion or moisturizer on your chest.  Change the electrodes daily or any time they stop sticking to your skin. You might need to use tape to keep them on.  It is possible that your skin under the electrodes could become irritated. To keep this from happening, try to put the electrodes in slightly different places on your chest. However, they must remain in the area under your left breast and in the upper right section of your chest.  Make sure  the monitor is safely clipped to your clothing or in a location close to your body that your health care provider recommends.  Press the button to record when you feel symptoms of heart trouble, such as dizziness, weakness, light-headedness, palpitations, thumping, shortness of breath, unexplained weakness, or a fluttering or racing heart. The monitor is always on and records what happened slightly before you pressed the button, so do not worry about being too late to get good information.  Keep a diary of your activities, such as walking, doing chores, and taking medicine. It is especially important to note what you were doing when you pushed the button to record your symptoms. This will help your health care provider determine what might be contributing to your symptoms. The information stored in your monitor will be reviewed by your health care provider alongside your diary entries.  Send the recorded information as recommended by your health care provider. It is important to  understand that it will take some time for your health care provider to process the results.  Change the batteries as recommended by your health care provider. SEEK IMMEDIATE MEDICAL CARE IF:   You have chest pain.  You have extreme difficulty breathing or shortness of breath.  You develop a very fast heartbeat that persists.  You develop dizziness that does not go away.  You faint or constantly feel you are about to faint.   This information is not intended to replace advice given to you by your health care provider. Make sure you discuss any questions you have with your health care provider.   Document Released: 07/01/2008 Document Revised: 10/13/2014 Document Reviewed: 03/21/2013 Elsevier Interactive Patient Education 2016 Elsevier Inc.    DO NOT GET THE WOUND FROM THE HEART MONITOR IMPLANT WET FOR 3 DAYS.  DO NOT SHOWER FOR 3 DAYS.

## 2016-05-20 NOTE — Progress Notes (Signed)
D/C instructions reviewed w/patient and family and copy given to family. D/C from Holding Area in wheelchair to home

## 2016-05-20 NOTE — H&P (View-Only) (Signed)
Cardiology Consultation Note    Patient ID: Nancy [name], MRN: [id number], DOB/AGE: [birth year] 82 y.o. Admit date: 04/30/2016   Date of Consult: 05/01/2016 Primary Physician: Carylon Perches, MD Primary Cardiologist: New to Dr. Purvis Sheffield  Chief Complaint: right foot weakness Reason for Consultation: embolic stroke Requesting MD: Dr. Ouida Sills  HPI: Nancy [name] is a 82 y.o. female (who appears younger than stated age) with history of hypothyroidism, HTN, hyperlipidemia, LLE DVT 07/2015, back surgery who presented to [facility] with stroke. She has no prior cardiac history. She was on Xarelto for 3 months after her DVT which was felt provoked by [facility] per d/w Dr. Ouida Sills. She had no issues with bleeding at that time. She walks with a walker as a precaution but has not had any falls.  The night before last (7/25) she noticed numbness of her right foot. When she got up in the middle of the night to use the restroom she noticed that she couldn't quite feel her foot planting on the ground. Yesterday morning she noticed she still had right sided weakness. No slurred speech, change in chronic blurry vision, or aphasia. She called her PCP and was advised to come to the hospital. MRI/MRA showed small acute infarct in the left cerebellar vermis, left lateral thalamus, and right high parietal lobe; question small emboli, hypoplastic left vertebral artery without definite significant proximal stenosis, aneurysm, or branch vessel occlusion; mild distal small vessel disease otherwise. Carotid duplex negative. She has been intermittently hypertensive during this stay up to 191/85. 2D echo pending. Labs notable for neg troponin, LDL 143, normal CBC, glucose 149, neg UDS, neg EtOH. A1c pending. She has been started on atorvastatin.   Cardiology is asked to weigh in regarding loop recorder. Pt denies palpitations, chest pain, dyspnea. Telemetry unrevealing - NSR.  Past Medical History:  Diagnosis Date  . DVT (deep venous  thrombosis) (HCC)    a. left lower extremity 07/2015.  . Glaucoma   . Hypertension   . Hypothyroidism   . Legally blind       Surgical History:  Past Surgical History:  Procedure Laterality Date  . NASAL SINUS SURGERY    . SPINE SURGERY       Home Meds: Prior to Admission medications   Medication Sig Start Date End Date Taking? Authorizing Provider  budesonide (PULMICORT) 0.5 MG/2ML nebulizer solution Take 0.5 mg by nebulization 2 (two) times daily.   Yes Historical Provider, MD  Cholecalciferol (VITAMIN D-3) 1000 UNITS CAPS Take 1,000 Units by mouth daily.    Yes Historical Provider, MD  dorzolamide (TRUSOPT) 2 % ophthalmic solution Place 1 drop into both eyes 3 (three) times daily.    Yes Historical Provider, MD  latanoprost (XALATAN) 0.005 % ophthalmic solution Place 1 drop into both eyes at bedtime.    Yes Historical Provider, MD  levothyroxine (SYNTHROID, LEVOTHROID) 88 MCG tablet Take 88 mcg by mouth daily before breakfast.   Yes Historical Provider, MD  lisinopril (PRINIVIL,ZESTRIL) 10 MG tablet Take 10 mg by mouth daily.   Yes Historical Provider, MD  DiphenhydrAMINE HCl (ALLERGY MED PO) Take 1 capsule by mouth daily as needed (insomnia,allergies).     Historical Provider, MD    Inpatient Medications:  .  stroke: mapping our early stages of recovery book   Does not apply Once  . aspirin  300 mg Rectal Daily   Or  . aspirin  325 mg Oral Daily  . atorvastatin  20 mg Oral q1800  . cholecalciferol  1,000 Units Oral Daily  . dorzolamide  1 drop Both Eyes TID  . enoxaparin (LOVENOX) injection  40 mg Subcutaneous Q24H  . latanoprost  1 drop Both Eyes QHS  . levothyroxine  88 mcg Oral QAC breakfast  . lisinopril  10 mg Oral Daily      Allergies: No Known Allergies  Social History   Social History  . Marital status: Widowed    Spouse name: N/A  . Number of children: N/A  . Years of education: N/A   Occupational History  . Not on file.   Social History Main Topics   . Smoking status: Never Smoker  . Smokeless tobacco: Never Used  . Alcohol use No  . Drug use: No  . Sexual activity: Not on file   Other Topics Concern  . Not on file   Social History Narrative  . No narrative on file     Family History  Problem Relation Age of Onset  . Breast cancer Mother     Died age 80  . Heart attack Father [id number]  . Breast cancer Sister      Review of Systems:no bleeding issues. No syncope.  All other systems reviewed and are otherwise negative except as noted above.  Labs:  Recent Labs  04/30/16 1115  TROPONINI <0.03   Lab Results  Component Value Date   WBC 6.9 04/30/2016   HGB 13.5 04/30/2016   HCT 40.3 04/30/2016   MCV 98.3 04/30/2016   PLT 198 04/30/2016    Recent Labs Lab 04/30/16 1115  NA 141  K 4.0  CL 108  CO2 27  BUN 15  CREATININE 0.74  CALCIUM 8.8*  PROT 6.7  BILITOT 0.6  ALKPHOS 84  ALT 14  AST 21  GLUCOSE 149*   Lab Results  Component Value Date   CHOL 204 (H) 05/01/2016   HDL 45 05/01/2016   LDLCALC 143 (H) 05/01/2016   TRIG 80 05/01/2016   No results found for: DDIMER  Radiology/Studies:  Mr Brain Wo Contrast (neuro Protocol)  Result Date: 04/30/2016 CLINICAL DATA:  Right-sided weakness and numbness.  Dizziness. EXAM: MRI HEAD WITHOUT CONTRAST TECHNIQUE: Multiplanar, multiecho pulse sequences of the brain and surrounding structures were obtained without intravenous contrast. COMPARISON:  MRI 06/05/2006 FINDINGS: Generalized atrophy with mild progression from the prior study. Negative for hydrocephalus. Multiple small areas of restricted diffusion compatible with acute infarction. Small area of acute infarct left cerebellar vermis Small infarct left lateral thalamus Small acute infarct high right parietal lobe. Moderate chronic microvascular ischemic changes are seen throughout the cerebral white matter. Mild chronic ischemic change in the pons. Small chronic infarct right cerebellum. Negative for hemorrhage or  mass lesion. No shift of the midline structures. Extensive mucosal edema throughout the paranasal sinuses. Bilateral lens extraction. No orbital mass lesion. Pituitary not enlarged. IMPRESSION: Small subcentimeter areas of acute infarct in the left cerebellar vermis, left lateral thalamus, and right high parietal lobe. Question small emboli. Atrophy and moderate chronic ischemic change Extensive mucosal thickening throughout the paranasal sinuses. Electronically Signed   By: Marlan Palauharles  Clark M.D.   On: 04/30/2016 12:26  [place] Carotid Bilateral  Result Date: 05/01/2016 CLINICAL DATA:  82 year old female with right-sided numbness and dizziness as well as a punctate left cerebellar infarct EXAM: BILATERAL CAROTID DUPLEX ULTRASOUND TECHNIQUE: Wallace CullensGray scale imaging, color Doppler and duplex ultrasound were performed of bilateral carotid and vertebral arteries in the neck. COMPARISON:  Brain MRI/ MRA 04/30/2016 FINDINGS: Criteria: Quantification of carotid stenosis is  based on velocity parameters that correlate the residual internal carotid diameter with NASCET-based stenosis levels, using the diameter of the distal internal carotid lumen as the denominator for stenosis measurement. The following velocity measurements were obtained: RIGHT ICA:  70/19 cm/sec CCA:  72/12 cm/sec SYSTOLIC ICA/CCA RATIO:  1.0 DIASTOLIC ICA/CCA RATIO:  1.5 ECA:  71 cm/sec LEFT ICA:  63/17 cm/sec CCA:  68/10 cm/sec SYSTOLIC ICA/CCA RATIO:  0.9 DIASTOLIC ICA/CCA RATIO:  1.7 ECA:  56 cm/sec RIGHT CAROTID ARTERY: No significant atherosclerotic plaque or evidence of stenosis. RIGHT VERTEBRAL ARTERY:  Patent with normal antegrade flow. LEFT CAROTID ARTERY: No significant atherosclerotic plaque or evidence of stenosis. LEFT VERTEBRAL ARTERY:  Patent with normal antegrade flow. IMPRESSION: Negative bilateral carotid duplex ultrasound. No significant atherosclerotic plaque or evidence of stenosis. Vertebral arteries are patent with antegrade flow. Signed,  Sterling Big, MD [facility] Radiology Electronically Signed   By: Malachy Moan M.D.   On: 05/01/2016 10:22  Mr Maxine Glenn Head/brain IF Cm  Result Date: 04/30/2016 CLINICAL DATA:  Right-sided numbness and dizziness. Punctate cerebellar infarct. Abnormal MRI the brain. EXAM: MRA HEAD WITHOUT CONTRAST TECHNIQUE: Angiographic images of the Circle of Willis were obtained using MRA technique without intravenous contrast. COMPARISON:  MRI brain from the same day. FINDINGS: Mild tortuosity is present in the cervical right internal carotid artery. The internal carotid arteries are otherwise within normal limits from the high cervical segments through the ICA termini bilaterally. The A1 and M1 segments are intact. There is no definite anterior communicating artery. The MCA bifurcations are intact. Mild distal segmental narrowing is present in the MCA bifurcations bilaterally. The left vertebral artery and is hypoplastic and terminates at the PICA. The right vertebral artery feeds the basilar artery peer left posterior cerebral artery originates from the basilar artery with contribution from a small left posterior communicating artery. The right posterior cerebral artery is of fetal type. Signal loss the posterior cerebral arteries is felt to be artifactual. IMPRESSION: 1. Hypoplastic left vertebral artery without definite significant proximal stenosis, aneurysm, or branch vessel occlusion. Mild distal small vessel disease within the anterior and posterior circulation. 2. Mild distal small vessel disease within the anterior and posterior circulation. Electronically Signed   By: Marin Roberts M.D.   On: 04/30/2016 21:36   Wt Readings from Last 3 Encounters:  04/30/16 130 lb 11.7 oz (59.3 kg)    EKG: NSR 86bpm no acute ST-T changes  Physical Exam: Blood pressure (!) 164/78, pulse 80, temperature 97.9 F (36.6 C), temperature source Oral, resp. rate 16,  height 5\' 6"  (1.676 m), weight 130 lb 11.7 oz (59.3 kg), SpO2 98 %. Body mass index is 21.1 kg/m. General: Well developed, well nourished thin WF, in no acute distress. Head: Normocephalic, atraumatic, sclera non-icteric, no xanthomas, nares are without discharge.  Neck: Negative for carotid bruits. JVD not elevated. Lungs: Clear bilaterally to auscultation without wheezes, rales, or rhonchi. Breathing is unlabored. Heart: RRR with S1 S2. No murmurs, rubs, or gallops appreciated. Abdomen: Soft, non-tender, non-distended with normoactive bowel sounds. No hepatomegaly. No rebound/guarding. No obvious abdominal masses. Msk:  Strength and tone appear normal for age. Extremities: No clubbing or cyanosis. No edema.  Distal pedal pulses are 2+ and equal bilaterally. Neuro: Alert and oriented X 3. No facial asymmetry. No focal deficit. Moves all extremities spontaneously. Psych:  Responds to questions appropriately with a normal affect.    Assessment and Plan   1. Suspected embolic CVA - no  evidence of AF on telemetry thus far. She is quite viable and I believe she would be a candidate for anticoagulation if embolic source of her stroke was identified. Await 2D echocardiogram. If this is negative for clot, would recommend outpatient TEE/EP consultation to implant loop recorder. [facility] - the patient would come in for her TEE and endoscopy would contact the EP service to notify them of her arrival. If TEE is negative for clot, then EP would see the patient to discuss ILR. If felt appropriate, she would undergo ILR implantation in the same day. I will send a message to our [facility] coordinator, Daun Peacockrish Trent, who handles these requests to help tentatively arrange. Risks/benefits/alternatives of TEE discussed with the patient who is in agreement.  2. HTN - further management per neurology given typical recommendations for permissive hypertension.  3. Hyperlipidemia - started on  atorvastatin given uncontrolled LDL. Recommend recheck 6-8 weeks (can be done by PCP).  Signed, Laurann Montanaayna N Dunn PA-C 05/01/2016, 10:52 AM Pager: [phone]  The patient was seen and examined, and I agree with the history, physical exam, assessment and plan as documented above which has been discussed with D. Dunn. Pt admitted with CVA. Denies chest pain, palpitations, and exertional dyspnea. 2-D echo pending. As stated above, we will arrange for outpatient TEE and implantable loop recorder. She will be contacted.  She is in agreement with this plan.  Prentice DockerSuresh Evalie Hargraves, MD, [facility]  05/01/2016 12:01 PM

## 2016-05-20 NOTE — Interval H&P Note (Signed)
History and Physical Interval Note:  05/20/2016 7:03 AM  Nancy GranaJoyce L Chiong  has presented today for surgery, with the diagnosis of stroke  The various methods of treatment have been discussed with the patient and family. After consideration of risks, benefits and other options for treatment, the patient has consented to  Procedure(s): TRANSESOPHAGEAL ECHOCARDIOGRAM (TEE) POSSIBLE LOOP RECORDER IMPLANTATION (N/A) as a surgical intervention .  The patient's history has been reviewed, patient examined, no change in status, stable for surgery.  I have reviewed the patient's chart and labs.  Questions were answered to the patient's satisfaction.     Kristeen MissPhilip Ellijah Leffel

## 2016-05-20 NOTE — CV Procedure (Signed)
    Transesophageal Echocardiogram Note  Nancy [name] [id number] [birth year]  Procedure: Transesophageal Echocardiogram Indications: CVA  Procedure Details Consent: Obtained Time Out: Verified patient identification, verified procedure, site/side was marked, verified correct patient position, special equipment/implants available, Radiology Safety Procedures followed,  medications/allergies/relevent history reviewed, required imaging and test results available.  Performed  Medications:  During this procedure the patient is administered a total of Versed 3 mg and Fentanyl 37.5  mcg  to achieve and maintain moderate conscious sedation.  The patient's heart rate, blood pressure, and oxygen saturation are monitored continuously during the procedure. The period of conscious sedation is 30  minutes, of which I was present face-to-face 100% of this time.  We had some slight resistance while passing the scope initially.   Eventually it passed without trouble. She had scant  bleeding from her mouth after the procedure  Left Ventrical:  Normal LV function.  Mitral Valve: normal structure,  Mild - moderate MR   Aortic Valve: mild moderate AI  Tricuspid Valve: trace TR   Pulmonic Valve: mild PI   Left Atrium/ Left atrial appendage: no thrombi  Atrial septum: no ASD or PFO by color Doppler or bubble study. She had the appearance of a late bubble in the LA after 7-10 cardiac cycles.   This is c/w a pulmonary AVM and not an ASD or PFO   Aorta: mild calcified atherosclerosis   Complications: No apparent complications Patient did tolerate procedure well.   Vesta MixerPhilip J. Nahser, Montez HagemanJr., MD, [facility] 05/20/2016, 8:24 AM

## 2016-05-21 ENCOUNTER — Encounter (HOSPITAL_COMMUNITY): Payer: Self-pay | Admitting: Cardiovascular Disease

## 2016-05-21 ENCOUNTER — Ambulatory Visit (HOSPITAL_COMMUNITY): Payer: Medicare Other | Admitting: Physical Therapy

## 2016-05-21 DIAGNOSIS — I639 Cerebral infarction, unspecified: Secondary | ICD-10-CM | POA: Diagnosis not present

## 2016-05-21 DIAGNOSIS — R262 Difficulty in walking, not elsewhere classified: Secondary | ICD-10-CM

## 2016-05-21 DIAGNOSIS — M6281 Muscle weakness (generalized): Secondary | ICD-10-CM

## 2016-05-21 DIAGNOSIS — R2681 Unsteadiness on feet: Secondary | ICD-10-CM

## 2016-05-21 NOTE — Therapy (Signed)
[facility] [address] [place], [place], [address] Phone: [phone]   Fax:  [phone]  Physical Therapy Treatment  Patient Details  Name: Nancy GranaJoyce L Gilland MRN: [id number] Date of Birth: [birth year] Referring Provider: Carylon Perchesoy Fagan   Encounter Date: 05/21/2016      PT End of Session - 05/21/16 1206    Visit Number 3   Number of Visits 18   Date for PT Re-Evaluation 06/02/16   Authorization Type U[employer] Medicare    Authorization Time Period 05/12/16 to 06/23/16   Authorization - Visit Number 3   Authorization - Number of Visits 10   PT Start Time 1122   PT Stop Time 1215   PT Time Calculation (min) 53 min   Activity Tolerance Patient tolerated treatment well   Behavior During Therapy [facility] for tasks assessed/performed      Past Medical History:  Diagnosis Date  . DVT (deep venous thrombosis) (HCC)    a. left lower extremity 07/2015.  . Glaucoma   . Hypertension   . Hypothyroidism   . Legally blind     Past Surgical History:  Procedure Laterality Date  . EP IMPLANTABLE DEVICE N/A 05/20/2016   Procedure: Loop Recorder Insertion;  Surgeon: Will Jorja LoaMartin Camnitz, MD;  [facility]: [facility];  Service: Cardiovascular;  Laterality: N/A;  . NASAL SINUS SURGERY    . SPINE SURGERY      There were no vitals filed for this visit.      Subjective Assessment - 05/21/16 1126    Subjective Pt states she had a procedure yesterday and is feeling a little tired today.  Pt had a transesophageal echocardiogram with a heart monitor placement beneath the skin under local anesthesia.                           [facility] Adult PT Treatment/Exercise - 05/21/16 0001      Ambulation/Gait   Ambulation/Gait Yes   Ambulation/Gait Assistance 4: Min guard   Ambulation Distance (Feet) 150 Feet   Assistive device Straight cane   Gait Comments unsteadiness noted, cues for sequcing/min guard for safety      Knee/Hip Exercises: Aerobic   Nustep level 2, hills 1, x8 minutes LE, UE      Knee/Hip Exercises: Standing   Heel Raises Both;1 set;15 reps   Heel Raises Limitations heel and toe raises    Forward Lunges Both;1 set;10 reps   Forward Lunges Limitations 4 inch box    Lateral Step Up Both;10 reps;Hand Hold: 1;Step Height: 4"   Lateral Step Up Limitations 4"   Forward Step Up Both;1 set;10 reps   Forward Step Up Limitations 4 inch box    Step Down Both;10 reps;Hand Hold: 1;Step Height: 4"   Step Down Limitations 4"   SLS bilaterally with 1 fingertip 30" each     Knee/Hip Exercises: Seated   Sit to Sand 2 sets;5 reps;without UE support     Knee/Hip Exercises: Supine   Bridges 10 reps;2 sets   Straight Leg Raises 10 reps;2 sets;Both     Knee/Hip Exercises: Sidelying   Hip ABduction Both;15 reps                  PT Short Term Goals - 05/12/16 1233      PT SHORT TERM GOAL #1   Title Patient to be able to ambulate 75500ft during 3MWT with rollator in order to demonstrate improved mobilty  and functional activity tolerance    Time 3   Period Weeks   Status New     PT SHORT TERM GOAL #2   Title Patient to be able to identify 5/5 safety items for use at home and community in order to demonstrate improved safety awareness/reduced fall risk    Time 3   Period Weeks   Status New     PT SHORT TERM GOAL #3   Title Patient to score at least 44 on the BERG in order to demonstrate overall reduced fall risk    Time 3   Period Weeks   Status New     PT SHORT TERM GOAL #4   Title Patient to be able to consistently and correctly perform appropriate HEP, to be updated PRN    Time 3   Period Weeks   Status New           PT Long Term Goals - 05/12/16 1236      PT LONG TERM GOAL #1   Title Patient to score MMT 5/5 in all tested groups in order to demonstrate improved strength for functional mobility based tasks    Time 6   Period Weeks   Status New     PT LONG TERM GOAL #2   Title Patient to score at  least a 50 on BERG balance test in order to demonstrate reduced overall fall risk    Time 6   Period Weeks   Status New     PT LONG TERM GOAL #3   Title Patient to be able to safely ascend/descend at least 4 stairs with U railing, good motor control, and minimal unsteadiness in order to improve overall mobility    Time 6   Period Weeks   Status New     PT LONG TERM GOAL #4   Title Patient to be participatory in regular exercise, at least 20 minutes at a time, at least 4 days per week, in order to maintain functional gains and improve overall health status    Time 6   Period Weeks   Status New               Plan - 05/21/16 1207    Clinical Impression Statement Able to continue per POC without any noted symptoms from yesterday's procedure.  Pt able to complete all exercises with 2, 1 minute seated rest breaks.  Completed nustep at EOS (not included in bililing). Added sit to stand, lateral step ups and forward step downs to work on eccentric strengthening of LE"s.  Sit to stand activity was most difficult for patient.  Pt with 3 LOB during gait using SPC but able to self correct.  Pt verbalized she thought these were due  to her vision.   Rehab Potential Good   PT Frequency 3x / week   PT Duration 6 weeks   PT Treatment/Interventions ADLs/Self Care Home Management;Biofeedback;DME Instruction;Gait training;Stair training;Functional mobility training;Therapeutic activities;Therapeutic exercise;Balance training;Neuromuscular re-education;Patient/family education;Manual techniques;Passive range of motion;Energy conservation;Taping   PT Next Visit Plan Continue to progress functional strength, balance, functional activity tolerance training. Focus on CKC. Continue nustep.   Consulted and Agree with Plan of Care Patient      Patient will benefit from skilled therapeutic intervention in order to improve the following deficits and impairments:  Abnormal gait, Improper body mechanics,  Postural dysfunction, Decreased activity tolerance, Decreased strength, Decreased balance, Decreased safety awareness, Difficulty walking, Impaired flexibility  Visit Diagnosis: Muscle weakness (generalized)  Unsteadiness  on feet  Difficulty in walking, not elsewhere classified     Problem List Patient Active Problem List   Diagnosis Date Noted  . Pressure ulcer 05/01/2016  . Hyperglycemia 05/01/2016  . Hyperlipidemia 05/01/2016  . Stroke (HCC) 04/30/2016  . Right sided weakness 04/30/2016  . Essential hypertension 04/30/2016  . Hypothyroidism 04/30/2016    Lurena Nidamy B Elliannah Wayment, PTA/CLT [phone]  05/21/2016, 12:12 PM  [place] [facility] [address] [place], [place], [address] Phone: [phone]   Fax:  [phone]  Name: Nancy GranaJoyce L Kuhnle MRN: [id number] Date of Birth: 04/06/1925

## 2016-05-22 ENCOUNTER — Ambulatory Visit (HOSPITAL_COMMUNITY): Payer: Medicare Other

## 2016-05-22 DIAGNOSIS — M6281 Muscle weakness (generalized): Secondary | ICD-10-CM

## 2016-05-22 DIAGNOSIS — R262 Difficulty in walking, not elsewhere classified: Secondary | ICD-10-CM

## 2016-05-22 DIAGNOSIS — R2681 Unsteadiness on feet: Secondary | ICD-10-CM

## 2016-05-22 NOTE — Therapy (Signed)
[place] [facility] [address] [place], [place], [address] Phone: [phone]   Fax:  [phone]  Physical Therapy Treatment  Patient Details  Name: Nancy [name] MRN: [id number] Date of Birth: 11-19-1924 Referring Provider: Carylon Perchesoy Fagan   Encounter Date: 05/22/2016      PT End of Session - 05/22/16 1516    Visit Number 5   Number of Visits 18   Date for PT Re-Evaluation 06/02/16   Authorization Type UHC Medicare    Authorization Time Period 05/12/16 to 06/23/16   Authorization - Visit Number 5   Authorization - Number of Visits 10   PT Start Time 1435   PT Stop Time 1516   PT Time Calculation (min) 41 min   Equipment Utilized During Treatment Gait belt   Activity Tolerance Patient tolerated treatment well   Behavior During Therapy [facility] for tasks assessed/performed      Past Medical History:  Diagnosis Date  . DVT (deep venous thrombosis) (HCC)    a. left lower extremity 07/2015.  . Glaucoma   . Hypertension   . Hypothyroidism   . Legally blind     Past Surgical History:  Procedure Laterality Date  . EP IMPLANTABLE DEVICE N/A 05/20/2016   Procedure: Loop Recorder Insertion;  Surgeon: Will Jorja LoaMartin Camnitz, MD;  [facility]: [facility];  Service: Cardiovascular;  Laterality: N/A;  . NASAL SINUS SURGERY    . SPINE SURGERY    . TEE WITHOUT CARDIOVERSION N/A 05/20/2016   Procedure: TRANSESOPHAGEAL ECHOCARDIOGRAM (TEE) POSSIBLE LOOP RECORDER IMPLANTATION;  Surgeon: Vesta MixerPhilip J Nahser, MD;  [facility];  Service: Cardiovascular;  Laterality: N/A;    There were no vitals filed for this visit.      Subjective Assessment - 05/22/16 1436    Subjective Pt stated she is feeling good today.  Reports the incision spot of the transesophageal echocardiogram is a little itchy today, no reports of pain.     Pertinent History HTN, CVA, hypothyroidism, hx of DVT, glaucoma, legally blind, multiple surgeries for a ruptured disc and a  bulging disc    Patient Stated Goals to get stronger, walk better, get balance better    Currently in Pain? No/denies             [facility] Treatment/Exercise - 05/22/16 0001      Ambulation/Gait   Ambulation/Gait Yes   Ambulation/Gait Assistance 4: Min guard   Ambulation Distance (Feet) 452 Feet   Assistive device Straight cane   Gait Comments minimal unstreadiness, min guard for safety     Knee/Hip Exercises: Standing   Heel Raises Both;1 set;15 reps   Heel Raises Limitations heel and toe raises    Forward Lunges Both;15 reps   Forward Lunges Limitations 4 inch box    Lateral Step Up Both;10 reps;Hand Hold: 1;Step Height: 4"   Lateral Step Up Limitations 4"   Forward Step Up Both;1 set;10 reps   Forward Step Up Limitations 4 inch box    Step Down Both;10 reps;Hand Hold: 1;Step Height: 4"   Step Down Limitations 4"   Functional Squat 10 reps   Functional Squat Limitations cueing for form   SLS Lt 17", Rt 13" max of 3   Gait Training SPC x 452 ft   Other Standing Knee Exercises Tandem stance 3x 30"; Tandem gait 1Rt; sidestep with RTB 2RT                  PT Short Term  Goals - 05/12/16 1233      PT SHORT TERM GOAL #1   Title Patient to be able to ambulate 734ft during with rollator in order to demonstrate improved mobilty and functional activity tolerance    Time 3   Period Weeks   Status New     PT SHORT TERM GOAL #2   Title Patient to be able to identify 5/5 safety items for use at home and community in order to demonstrate improved safety awareness/reduced fall risk    Time 3   Period Weeks   Status New     PT SHORT TERM GOAL #3   Title Patient to score at least 44 on the BERG in order to demonstrate overall reduced fall risk    Time 3   Period Weeks   Status New     PT SHORT TERM GOAL #4   Title Patient to be able to consistently and correctly perform appropriate HEP, to be updated PRN    Time 3   Period Weeks   Status New            PT Long Term Goals - 05/12/16 1236      PT LONG TERM GOAL #1   Title Patient to score MMT 5/5 in all tested groups in order to demonstrate improved strength for functional mobility based tasks    Time 6   Period Weeks   Status New     PT LONG TERM GOAL #2   Title Patient to score at least a 50 on BERG balance test in order to demonstrate reduced overall fall risk    Time 6   Period Weeks   Status New     PT LONG TERM GOAL #3   Title Patient to be able to safely ascend/descend at least 4 stairs with U railing, good motor control, and minimal unsteadiness in order to improve overall mobility    Time 6   Period Weeks   Status New     PT LONG TERM GOAL #4   Title Patient to be participatory in regular exercise, at least 20 minutes at a time, at least 4 days per week, in order to maintain functional gains and improve overall health status    Time 6   Period Weeks   Status New               Plan - 05/22/16 1532    Clinical Impression Statement Session focus on improving functional strengtheing and progressed balance training.  Added squats for functional strengthening and additional balance activities including tandem gait and sidestepping with theraband resistance.  Increased distance with SPC gait training with initially 3 point then 2 point sequence with 3 LOB episodes mainly with turns though able to self correct.  Pt verbalized she thinks her vision plays a part in the LOB.  Nustep unavailable at EOS, do plan to resume next session.     Rehab Potential Good   PT Frequency 3x / week   PT Duration 6 weeks   PT Treatment/Interventions ADLs/Self Care Home Management;Biofeedback;DME Instruction;Gait training;Stair training;Functional mobility training;Therapeutic activities;Therapeutic exercise;Balance training;Neuromuscular re-education;Patient/family education;Manual techniques;Passive range of motion;Energy conservation;Taping   PT Next Visit Plan Continue to  progress functional strength, balance, functional activity tolerance training. Focus on CKC. Continue nustep.      Patient will benefit from skilled therapeutic intervention in order to improve the following deficits and impairments:  Abnormal gait, Improper body mechanics, Postural dysfunction, Decreased activity tolerance, Decreased strength, Decreased  balance, Decreased safety awareness, Difficulty walking, Impaired flexibility  Visit Diagnosis: Muscle weakness (generalized)  Unsteadiness on feet  Difficulty in walking, not elsewhere classified     Problem List Patient Active Problem List   Diagnosis Date Noted  . Pressure ulcer 05/01/2016  . Hyperglycemia 05/01/2016  . Hyperlipidemia 05/01/2016  . Stroke (HCC) 04/30/2016  . Right sided weakness 04/30/2016  . Essential hypertension 04/30/2016  . Hypothyroidism 04/30/2016   Becky Saxasey Carder Yin, LPTA; CBIS [phone]  Juel BurrowCockerham, Delois Silvester Jo 05/22/2016, 5:23 PM  [place] [facility] [address] [place], [place], [address] Phone: [phone]   Fax:  [phone]  Name: Nancy GranaJoyce L [name] MRN: [id number] Date of Birth: 1924-10-26

## 2016-05-27 ENCOUNTER — Ambulatory Visit (HOSPITAL_COMMUNITY): Payer: Medicare Other | Admitting: Physical Therapy

## 2016-05-27 DIAGNOSIS — M6281 Muscle weakness (generalized): Secondary | ICD-10-CM | POA: Diagnosis not present

## 2016-05-27 DIAGNOSIS — R2681 Unsteadiness on feet: Secondary | ICD-10-CM

## 2016-05-27 DIAGNOSIS — R262 Difficulty in walking, not elsewhere classified: Secondary | ICD-10-CM

## 2016-05-27 NOTE — Therapy (Signed)
[place] [facility] [address] [place], [place], [address] Phone: [phone]   Fax:  [phone]  Physical Therapy Treatment  Patient Details  Name: Nancy [name] MRN: [id number] Date of Birth: Jul 19, 1925 Referring Provider: Carylon Perches   Encounter Date: 05/27/2016      PT End of Session - 05/27/16 1746    Visit Number 6   Number of Visits 18   Date for PT Re-Evaluation 06/02/16   Authorization Type UHC Medicare    Authorization Time Period 05/12/16 to 06/23/16   Authorization - Visit Number 6   Authorization - Number of Visits 10   PT Start Time 1606  patient arrived a few minutes late    PT Stop Time 1643   PT Time Calculation (min) 37 min   Equipment Utilized During Treatment Gait belt   Activity Tolerance Patient tolerated treatment well   Behavior During Therapy [facility] for tasks assessed/performed      Past Medical History:  Diagnosis Date  . DVT (deep venous thrombosis) (HCC)    a. left lower extremity 07/2015.  . Glaucoma   . Hypertension   . Hypothyroidism   . Legally blind     Past Surgical History:  Procedure Laterality Date  . EP IMPLANTABLE DEVICE N/A 05/20/2016   Procedure: Loop Recorder Insertion;  Surgeon: Will Jorja Loa, MD;  [facility];  Service: Cardiovascular;  Laterality: N/A;  . NASAL SINUS SURGERY    . SPINE SURGERY    . TEE WITHOUT CARDIOVERSION N/A 05/20/2016   Procedure: TRANSESOPHAGEAL ECHOCARDIOGRAM (TEE) POSSIBLE LOOP RECORDER IMPLANTATION;  Surgeon: Vesta Mixer, MD;  [facility] ENDOSCOPY;  Service: Cardiovascular;  Laterality: N/A;    There were no vitals filed for this visit.      Subjective Assessment - 05/27/16 1608    Subjective Patient arrives stating she is feeling well today, no major changes since last session    Pertinent History HTN, CVA, hypothyroidism, hx of DVT, glaucoma, legally blind, multiple surgeries for a ruptured disc and a bulging disc    Currently  in Pain? No/denies                         [facility] Adult PT Treatment/Exercise - 05/27/16 0001      Ambulation/Gait   Ambulation/Gait Yes   Ambulation/Gait Assistance 4: Min guard   Ambulation Distance (Feet) 390 Feet   Assistive device Straight cane   Gait Comments minimal unstreadiness, min guard for safety, verbal cues to reduce narrow BOS/scissoring      Knee/Hip Exercises: Standing   Heel Raises Both;1 set;20 reps   Heel Raises Limitations heel and toe    Forward Lunges Both;1 set;15 reps   Forward Lunges Limitations 4 inch box    Side Lunges Both;1 set;10 reps   Side Lunges Limitations 4 inch box    Lateral Step Up Both;1 set;15 reps   Lateral Step Up Limitations 4 inch box    Forward Step Up Both;1 set;15 reps   Forward Step Up Limitations 4 inch box    Other Standing Knee Exercises staggered sit to stand 2x5 each LE, minimal use of UEs   min assist to prevent L knee valgus/fall in              Balance Exercises - 05/27/16 1745      Balance Exercises: Standing   Tandem Stance Eyes open;3 reps;10 secs  PT Education - 05/27/16 1746    Education provided No          PT Short Term Goals - 05/12/16 1233      PT SHORT TERM GOAL #1   Title Patient to be able to ambulate 71100ft during 3MWT with rollator in order to demonstrate improved mobilty and functional activity tolerance    Time 3   Period Weeks   Status New     PT SHORT TERM GOAL #2   Title Patient to be able to identify 5/5 safety items for use at home and community in order to demonstrate improved safety awareness/reduced fall risk    Time 3   Period Weeks   Status New     PT SHORT TERM GOAL #3   Title Patient to score at least 44 on the BERG in order to demonstrate overall reduced fall risk    Time 3   Period Weeks   Status New     PT SHORT TERM GOAL #4   Title Patient to be able to consistently and correctly perform appropriate HEP, to be updated PRN    Time 3    Period Weeks   Status New           PT Long Term Goals - 05/12/16 1236      PT LONG TERM GOAL #1   Title Patient to score MMT 5/5 in all tested groups in order to demonstrate improved strength for functional mobility based tasks    Time 6   Period Weeks   Status New     PT LONG TERM GOAL #2   Title Patient to score at least a 50 on BERG balance test in order to demonstrate reduced overall fall risk    Time 6   Period Weeks   Status New     PT LONG TERM GOAL #3   Title Patient to be able to safely ascend/descend at least 4 stairs with U railing, good motor control, and minimal unsteadiness in order to improve overall mobility    Time 6   Period Weeks   Status New     PT LONG TERM GOAL #4   Title Patient to be participatory in regular exercise, at least 20 minutes at a time, at least 4 days per week, in order to maintain functional gains and improve overall health status    Time 6   Period Weeks   Status New               Plan - 05/27/16 1746    Clinical Impression Statement Continued with functional strengthening today in CKC positions; noted significant difficulty with staggered sit to stand (no UEs) from mat table today, required min(A) to reduce knee valgus L and did respond well to strong verbal cuing for muscle activation during activity today. Continued gait with SPC with min guard for safety due to unsteadiness/low vision and occasional cues to reduce narrow BOS/scissoring. Finished session with balance work in parallel bars and recommend increased focus on static and dynamic balance tasks moving forward.    Rehab Potential Good   PT Frequency 3x / week   PT Duration 6 weeks   PT Treatment/Interventions ADLs/Self Care Home Management;Biofeedback;DME Instruction;Gait training;Stair training;Functional mobility training;Therapeutic activities;Therapeutic exercise;Balance training;Neuromuscular re-education;Patient/family education;Manual techniques;Passive range of  motion;Energy conservation;Taping   PT Next Visit Plan continue CKC strengthening and staggered sit to stand work; increase focus on static and dynamic balance; continue Nustep    PT Home Exercise  Plan assigned to patient including TANDEM STANCE, SLS, SUPINE BRIDGE, sidelying hip abduction and prone hip extension    Consulted and Agree with Plan of Care Patient      Patient will benefit from skilled therapeutic intervention in order to improve the following deficits and impairments:  Abnormal gait, Improper body mechanics, Postural dysfunction, Decreased activity tolerance, Decreased strength, Decreased balance, Decreased safety awareness, Difficulty walking, Impaired flexibility  Visit Diagnosis: Muscle weakness (generalized)  Unsteadiness on feet  Difficulty in walking, not elsewhere classified     Problem List Patient Active Problem List   Diagnosis Date Noted  . Pressure ulcer 05/01/2016  . Hyperglycemia 05/01/2016  . Hyperlipidemia 05/01/2016  . Stroke (HCC) 04/30/2016  . Right sided weakness 04/30/2016  . Essential hypertension 04/30/2016  . Hypothyroidism 04/30/2016    Nedra HaiKristen Mateja Dier PT, DPT [phone]  [facility] [address] [place], [place], [address] Phone: [phone]   Fax:  [phone]  Name: Nancy GranaJoyce L Fullenwider MRN: [id number] Date of Birth: 03-Mar-1925

## 2016-05-28 ENCOUNTER — Ambulatory Visit (HOSPITAL_COMMUNITY): Payer: Medicare Other | Admitting: Physical Therapy

## 2016-05-28 DIAGNOSIS — M6281 Muscle weakness (generalized): Secondary | ICD-10-CM

## 2016-05-28 DIAGNOSIS — R2681 Unsteadiness on feet: Secondary | ICD-10-CM

## 2016-05-28 DIAGNOSIS — R262 Difficulty in walking, not elsewhere classified: Secondary | ICD-10-CM

## 2016-05-28 NOTE — Therapy (Signed)
[facility] [address] [place], [place], [address] Phone: [phone]   Fax:  [phone]  Physical Therapy Treatment  Patient Details  Name: Nancy [name] MRN: [id number] Date of Birth: August 10, 1925 Referring Provider: Carylon Perches   Encounter Date: 05/28/2016      PT End of Session - 05/28/16 1739    Visit Number 7   Number of Visits 18   Date for PT Re-Evaluation 06/02/16   Authorization Type [employer] Medicare    Authorization Time Period 05/12/16 to 06/23/16   Authorization - Visit Number 7   Authorization - Number of Visits 10   PT Start Time 1648   PT Stop Time 1731   PT Time Calculation (min) 43 min   Equipment Utilized During Treatment Gait belt   Activity Tolerance Patient tolerated treatment well   Behavior During Therapy [facility] for tasks assessed/performed      Past Medical History:  Diagnosis Date  . DVT (deep venous thrombosis) (HCC)    a. left lower extremity 07/2015.  . Glaucoma   . Hypertension   . Hypothyroidism   . Legally blind     Past Surgical History:  Procedure Laterality Date  . EP IMPLANTABLE DEVICE N/A 05/20/2016   Procedure: Loop Recorder Insertion;  Surgeon: Will Jorja Loa, MD;  Location: [facility] INVASIVE [facility] LAB;  Service: Cardiovascular;  Laterality: N/A;  . NASAL SINUS SURGERY    . SPINE SURGERY    . TEE WITHOUT CARDIOVERSION N/A 05/20/2016   Procedure: TRANSESOPHAGEAL ECHOCARDIOGRAM (TEE) POSSIBLE LOOP RECORDER IMPLANTATION;  Surgeon: Vesta Mixer, MD;  Location: [facility] ENDOSCOPY;  Service: Cardiovascular;  Laterality: N/A;    There were no vitals filed for this visit.      Subjective Assessment - 05/28/16 1650    Subjective Patient arrives today stating she was feeling a little bit of muscle soreness, no major changes since yesterday. Her next MD appointment is next Tuesday with her PCP.    Currently in Pain? Yes   Pain Score 5    Pain Location Knee   Pain Orientation Left   Pain Descriptors /  Indicators Discomfort   Pain Type Acute pain   Pain Radiating Towards none    Pain Onset In the past 7 days   Pain Frequency Intermittent   Aggravating Factors  walking    Pain Relieving Factors not sure, pain usually does not last too long    Effect of Pain on Daily Activities none                          [facility] Adult PT Treatment/Exercise - 05/28/16 0001      Ambulation/Gait   Ambulation/Gait Yes   Ambulation/Gait Assistance 4: Min guard   Ambulation Distance (Feet) 390 Feet   Assistive device Straight cane   Gait Comments minimal unstreadiness, min guard for safety, verbal cues to reduce narrow BOS/scissoring      Knee/Hip Exercises: Standing   Heel Raises Both;1 set;20 reps   Heel Raises Limitations heel and toe    Lateral Step Up Both;1 set;15 reps   Lateral Step Up Limitations 6 inch box   min(A) for hip alignment    Forward Step Up Both;1 set;15 reps   Forward Step Up Limitations 6 inch box   min(A) for hip alignment    Other Standing Knee Exercises staggered sit to stand 2x5 each LE, minimal use of UEs   min guard/assist for knee alignment,  verbal facilitation              Balance Exercises - 05/28/16 1738      Balance Exercises: Standing   Standing Eyes Closed Narrow base of support (BOS);3 reps;20 secs;Foam/compliant surface;Other (comment)  one rep, 30 seconds solid surface    Tandem Stance Eyes open;3 reps;10 secs   Tandem Gait Forward;Intermittent upper extremity support;4 reps;Other reps (comment);Other (comment)  4 reps of 788ft in parallel bars            PT Education - 05/28/16 1739    Education provided Yes   Education Details wide BOS during activities to improve balance    Person(s) Educated Patient   Methods Explanation   Comprehension Verbalized understanding          PT Short Term Goals - 05/12/16 1233      PT SHORT TERM GOAL #1   Title Patient to be able to ambulate 73300ft during 3MWT with rollator in order to  demonstrate improved mobilty and functional activity tolerance    Time 3   Period Weeks   Status New     PT SHORT TERM GOAL #2   Title Patient to be able to identify 5/5 safety items for use at home and community in order to demonstrate improved safety awareness/reduced fall risk    Time 3   Period Weeks   Status New     PT SHORT TERM GOAL #3   Title Patient to score at least 44 on the BERG in order to demonstrate overall reduced fall risk    Time 3   Period Weeks   Status New     PT SHORT TERM GOAL #4   Title Patient to be able to consistently and correctly perform appropriate HEP, to be updated PRN    Time 3   Period Weeks   Status New           PT Long Term Goals - 05/12/16 1236      PT LONG TERM GOAL #1   Title Patient to score MMT 5/5 in all tested groups in order to demonstrate improved strength for functional mobility based tasks    Time 6   Period Weeks   Status New     PT LONG TERM GOAL #2   Title Patient to score at least a 50 on BERG balance test in order to demonstrate reduced overall fall risk    Time 6   Period Weeks   Status New     PT LONG TERM GOAL #3   Title Patient to be able to safely ascend/descend at least 4 stairs with U railing, good motor control, and minimal unsteadiness in order to improve overall mobility    Time 6   Period Weeks   Status New     PT LONG TERM GOAL #4   Title Patient to be participatory in regular exercise, at least 20 minutes at a time, at least 4 days per week, in order to maintain functional gains and improve overall health status    Time 6   Period Weeks   Status New               Plan - 05/28/16 1740    Clinical Impression Statement Continued working on functional CKC strengthening; note that patient tends to rotate hips during lateral activities, showing compensation rather than targeted proximal musculature and provided Min(A) for total body alignment with patient reported increased difficulty of  strengthening tasks with this  correction. Continue to note significant weakness in hip extensors as noted by difficulty with sit to stand in staggered stance with no UEs, min(A) to reduce knee valgus and verbal facilitation of muscle activation. Increased focus on static and dynamic balance activities today as well today, with noted difficulty in positions with limited BOS. Patient displayed muscular and physical fatigue during today's session and rest breaks were provided PRN.    Rehab Potential Good   PT Frequency 3x / week   PT Duration 6 weeks   PT Treatment/Interventions ADLs/Self Care Home Management;Biofeedback;DME Instruction;Gait training;Stair training;Functional mobility training;Therapeutic activities;Therapeutic exercise;Balance training;Neuromuscular re-education;Patient/family education;Manual techniques;Passive range of motion;Energy conservation;Taping   PT Next Visit Plan continue proximal and CKC strengthening with focus on correct alignment to target correct musculature. Continue increased focus on static and dynamic balance. Trial Nustep.    PT Home Exercise Plan assigned to patient including TANDEM STANCE, SLS, SUPINE BRIDGE, sidelying hip abduction and prone hip extension    Consulted and Agree with Plan of Care Patient      Patient will benefit from skilled therapeutic intervention in order to improve the following deficits and impairments:  Abnormal gait, Improper body mechanics, Postural dysfunction, Decreased activity tolerance, Decreased strength, Decreased balance, Decreased safety awareness, Difficulty walking, Impaired flexibility  Visit Diagnosis: Muscle weakness (generalized)  Unsteadiness on feet  Difficulty in walking, not elsewhere classified     Problem List Patient Active Problem List   Diagnosis Date Noted  . Pressure ulcer 05/01/2016  . Hyperglycemia 05/01/2016  . Hyperlipidemia 05/01/2016  . Stroke (HCC) 04/30/2016  . Right sided weakness  04/30/2016  . Essential hypertension 04/30/2016  . Hypothyroidism 04/30/2016    Nedra HaiKristen Mattilyn Crites PT, DPT [phone]  [facility] [address] [place], [place], [address] Phone: [phone]   Fax:  [phone]  Name: Nancy [name] MRN: [id number] Date of Birth: [birth year]

## 2016-05-29 ENCOUNTER — Ambulatory Visit (HOSPITAL_COMMUNITY): Payer: Medicare Other

## 2016-05-29 DIAGNOSIS — M6281 Muscle weakness (generalized): Secondary | ICD-10-CM

## 2016-05-29 DIAGNOSIS — R262 Difficulty in walking, not elsewhere classified: Secondary | ICD-10-CM

## 2016-05-29 DIAGNOSIS — R2681 Unsteadiness on feet: Secondary | ICD-10-CM

## 2016-05-29 NOTE — Therapy (Signed)
[facility] [address] [place], [place], [address] Phone: [phone]   Fax:  [phone]  Physical Therapy Treatment  Patient Details  Name: Nancy [name] MRN: [id number] Date of Birth: April 28, 1925 Referring Provider: Carylon Perches   Encounter Date: 05/29/2016      PT End of Session - 05/29/16 1125    Visit Number 8   Number of Visits 18   Date for PT Re-Evaluation 06/02/16   Authorization Type [employer] Medicare    Authorization Time Period 05/12/16 to 06/23/16   Authorization - Visit Number 8   Authorization - Number of Visits 10   PT Start Time 1120   PT Stop Time 1208   PT Time Calculation (min) 48 min   Equipment Utilized During Treatment Gait belt   Activity Tolerance Patient tolerated treatment well   Behavior During Therapy [facility] for tasks assessed/performed      Past Medical History:  Diagnosis Date  . DVT (deep venous thrombosis) (HCC)    a. left lower extremity 07/2015.  . Glaucoma   . Hypertension   . Hypothyroidism   . Legally blind     Past Surgical History:  Procedure Laterality Date  . EP IMPLANTABLE DEVICE N/A 05/20/2016   Procedure: Loop Recorder Insertion;  Surgeon: Will Jorja Loa, MD;  Location: [facility] INVASIVE CV LAB;  Service: Cardiovascular;  Laterality: N/A;  . NASAL SINUS SURGERY    . SPINE SURGERY    . TEE WITHOUT CARDIOVERSION N/A 05/20/2016   Procedure: TRANSESOPHAGEAL ECHOCARDIOGRAM (TEE) POSSIBLE LOOP RECORDER IMPLANTATION;  Surgeon: Vesta Mixer, MD;  Location: [facility] ENDOSCOPY;  Service: Cardiovascular;  Laterality: N/A;    There were no vitals filed for this visit.      Subjective Assessment - 05/29/16 1123    Subjective Pt stated she is feeling good today, no reports of pain or recent falls.  Feels her balance is most difficult currently.     Pertinent History HTN, CVA, hypothyroidism, hx of DVT, glaucoma, legally blind, multiple surgeries for a ruptured disc and a bulging disc    Patient Stated  Goals to get stronger, walk better, get balance better    Currently in Pain? No/denies                         [facility] Adult PT Treatment/Exercise - 05/29/16 0001      Ambulation/Gait   Ambulation/Gait Yes   Ambulation/Gait Assistance 4: Min guard   Ambulation Distance (Feet) 452 Feet   Assistive device Straight cane   Gait Comments minimal unstreadiness especially at corners, min guard for safety, verbal cues to reduce narrow BOS/scissoring      Knee/Hip Exercises: Aerobic   Nustep level 2, hills 2, x8 minutes LE, UE      Knee/Hip Exercises: Standing   Heel Raises 20 reps   Heel Raises Limitations heel and toe    Hip Extension Both;10 reps;Knee straight  alternating   Extension Limitations alternating   Lateral Step Up Both;1 set;15 reps   Lateral Step Up Limitations 6 inch box    Forward Step Up Both;1 set;15 reps   Forward Step Up Limitations 6 inch box    SLS Lt 22", Rt 13" max of 3   Gait Training SPC x 452 ft   Other Standing Knee Exercises UE extension alternating   Other Standing Knee Exercises opp UE/ LE extension 2 sets x10 with min cueing for sequence  Balance Exercises - 05/29/16 1141      Balance Exercises: Standing   Tandem Stance Eyes open;3 reps;30 secs   Tandem Gait Forward;2 reps  no UE            PT Education - 05/28/16 1739    Education provided Yes   Education Details wide BOS during activities to improve balance    Person(s) Educated Patient   Methods Explanation   Comprehension Verbalized understanding          PT Short Term Goals - 05/12/16 1233      PT SHORT TERM GOAL #1   Title Patient to be able to ambulate 71700ft during 3MWT with rollator in order to demonstrate improved mobilty and functional activity tolerance    Time 3   Period Weeks   Status New     PT SHORT TERM GOAL #2   Title Patient to be able to identify 5/5 safety items for use at home and community in order to demonstrate improved  safety awareness/reduced fall risk    Time 3   Period Weeks   Status New     PT SHORT TERM GOAL #3   Title Patient to score at least 44 on the BERG in order to demonstrate overall reduced fall risk    Time 3   Period Weeks   Status New     PT SHORT TERM GOAL #4   Title Patient to be able to consistently and correctly perform appropriate HEP, to be updated PRN    Time 3   Period Weeks   Status New           PT Long Term Goals - 05/12/16 1236      PT LONG TERM GOAL #1   Title Patient to score MMT 5/5 in all tested groups in order to demonstrate improved strength for functional mobility based tasks    Time 6   Period Weeks   Status New     PT LONG TERM GOAL #2   Title Patient to score at least a 50 on BERG balance test in order to demonstrate reduced overall fall risk    Time 6   Period Weeks   Status New     PT LONG TERM GOAL #3   Title Patient to be able to safely ascend/descend at least 4 stairs with U railing, good motor control, and minimal unsteadiness in order to improve overall mobility    Time 6   Period Weeks   Status New     PT LONG TERM GOAL #4   Title Patient to be participatory in regular exercise, at least 20 minutes at a time, at least 4 days per week, in order to maintain functional gains and improve overall health status    Time 6   Period Weeks   Status New               Plan - 05/29/16 1204    Clinical Impression Statement Session focus on improving functional CKC strengthening and balance training.  Added UE/LE extension based exercise for strengthening and to improve sequence with gait with min guard required with new activity.  Continued gait training with SPC with min guard required for unsteady gait, pt able to recover LOB episodes with min guard.  Progressed balance with least UE A with tandem stance/gait with min A required for LOB.  EOS no reprts of pain, pt was limited by fatigue.     Rehab Potential Good  PT Frequency 3x / week    PT Duration 6 weeks   PT Treatment/Interventions ADLs/Self Care Home Management;Biofeedback;DME Instruction;Gait training;Stair training;Functional mobility training;Therapeutic activities;Therapeutic exercise;Balance training;Neuromuscular re-education;Patient/family education;Manual techniques;Passive range of motion;Energy conservation;Taping   PT Next Visit Plan continue proximal and CKC strengthening with focus on correct alignment to target correct musculature. Continue increased focus on static and dynamic balance. Trial Nustep.    PT Home Exercise Plan assigned to patient including TANDEM STANCE, SLS, SUPINE BRIDGE, sidelying hip abduction and prone hip extension   No additional exercises given this session.      Patient will benefit from skilled therapeutic intervention in order to improve the following deficits and impairments:  Abnormal gait, Improper body mechanics, Postural dysfunction, Decreased activity tolerance, Decreased strength, Decreased balance, Decreased safety awareness, Difficulty walking, Impaired flexibility  Visit Diagnosis: Muscle weakness (generalized)  Unsteadiness on feet  Difficulty in walking, not elsewhere classified     Problem List Patient Active Problem List   Diagnosis Date Noted  . Pressure ulcer 05/01/2016  . Hyperglycemia 05/01/2016  . Hyperlipidemia 05/01/2016  . Stroke (HCC) 04/30/2016  . Right sided weakness 04/30/2016  . Essential hypertension 04/30/2016  . Hypothyroidism 04/30/2016   Becky Saxasey Cockerham, LPTA; CBIS [phone]  Juel BurrowCockerham, Casey Jo 05/29/2016, 12:16 PM  [facility] [address] [place], [place], [address] Phone: [phone]   Fax:  [phone]  Name: Nancy [name] MRN: [id number] Date of Birth: 03-09-1925

## 2016-06-02 ENCOUNTER — Encounter: Payer: Self-pay | Admitting: Cardiology

## 2016-06-02 ENCOUNTER — Ambulatory Visit (HOSPITAL_COMMUNITY): Payer: Medicare Other | Admitting: Physical Therapy

## 2016-06-02 ENCOUNTER — Ambulatory Visit (INDEPENDENT_AMBULATORY_CARE_PROVIDER_SITE_OTHER): Payer: Medicare Other | Admitting: *Deleted

## 2016-06-02 DIAGNOSIS — M6281 Muscle weakness (generalized): Secondary | ICD-10-CM

## 2016-06-02 DIAGNOSIS — Z95818 Presence of other cardiac implants and grafts: Secondary | ICD-10-CM

## 2016-06-02 DIAGNOSIS — R2681 Unsteadiness on feet: Secondary | ICD-10-CM

## 2016-06-02 DIAGNOSIS — R262 Difficulty in walking, not elsewhere classified: Secondary | ICD-10-CM

## 2016-06-02 LAB — CUP PACEART INCLINIC DEVICE CHECK: Date Time Interrogation Session: 20170828155506

## 2016-06-02 NOTE — Therapy (Signed)
[facility] [address] [place], [place], [address] Phone: [phone]   Fax:  [phone]  Physical Therapy Treatment (Re-Assessment)  Patient Details  Name: Nancy [name] MRN: [id number] Date of Birth: 14-Jul-1925 Referring Provider: Carylon Perches   Encounter Date: 06/02/2016      PT End of Session - 06/02/16 1223    Visit Number 9   Number of Visits 18   Date for PT Re-Evaluation 06/23/16   Authorization Type [employer] Medicare (G-codes done 9th session)   Authorization Time Period 05/12/16 to 06/23/16   Authorization - Visit Number 9   Authorization - Number of Visits 19   PT Start Time 1115   PT Stop Time 1155   PT Time Calculation (min) 40 min   Equipment Utilized During Treatment Gait belt   Activity Tolerance Patient tolerated treatment well   Behavior During Therapy [facility] for tasks assessed/performed      Past Medical History:  Diagnosis Date  . DVT (deep venous thrombosis) (HCC)    a. left lower extremity 07/2015.  . Glaucoma   . Hypertension   . Hypothyroidism   . Legally blind     Past Surgical History:  Procedure Laterality Date  . EP IMPLANTABLE DEVICE N/A 05/20/2016   Procedure: Loop Recorder Insertion;  Surgeon: Will Jorja Loa, MD;  Location: [facility] INVAS[facility];  Service: Cardiovascular;  Laterality: N/A;  . NASAL SINUS SURGERY    . SPINE SURGERY    . TEE WITHOUT CARDIOVERSION N/A 05/20/2016   Procedure: TRANSESOPHAGEAL ECHOCARDIOGRAM (TEE) POSSIBLE LOOP RECORDER IMPLANTATION;  Surgeon: Vesta Mixer, MD;  Location: [facility] ENDOSCOPY;  Service: Cardiovascular;  Laterality: N/A;    There were no vitals filed for this visit.      Subjective Assessment - 06/02/16 1117    Subjective Patient arrives today stating that she feels like she was expecting too much, she thought she would be able to do more than she can right now. She is doing alright otherwise, she rates herself 30/100 on a subjective scale as she cannot  stand by herself too  long or walk by herself. She feels like her legs are getting a little stronger but not much.    Pertinent History HTN, CVA, hypothyroidism, hx of DVT, glaucoma, legally blind, multiple surgeries for a ruptured disc and a bulging disc    How long can you sit comfortably? 8/28- unlimited    How long can you stand comfortably? 8/28- unlimited if she is leaning on something, 5 minutes if she is not    How long can you walk comfortably? 8/28- with walker, feels tired after about 5 minutes    Patient Stated Goals to get stronger, walk better, get balance better    Currently in Pain? No/denies            [facility] PT Assessment - 06/02/16 1126      Strength   Right Hip Flexion 4+/5   Right Hip Extension 2+/5   Right Hip ABduction 3+/5   Left Hip Flexion 4+/5   Left Hip Extension 2+/5   Left Hip ABduction 3/5   Right Knee Flexion 4/5   Right Knee Extension 4/5   Left Knee Flexion 4/5   Left Knee Extension 4/5   Right Ankle Dorsiflexion 5/5   Left Ankle Dorsiflexion 5/5     Ambulation/Gait   Ambulation/Gait Yes   Ambulation/Gait Assistance 4: Min guard   Ambulation Distance (Feet) 211 Feet   Assistive device None  Gait Comments occasional cues for obstacle navigation      6 minute walk test results    Aerobic Endurance Distance Walked 493   Endurance additional comments 3MWT, rollator     Berg Balance Test   Sit to Stand Able to stand without using hands and stabilize independently   Standing Unsupported Able to stand safely 2 minutes   Sitting with Back Unsupported but Feet Supported on Floor or Stool Able to sit safely and securely 2 minutes   Stand to Sit Sits safely with minimal use of hands   Transfers Able to transfer safely, minor use of hands   Standing Unsupported with Eyes Closed Able to stand 10 seconds safely   Standing Ubsupported with Feet Together Able to place feet together independently and stand 1 minute safely   From Standing, Reach Forward  with Outstretched Arm Can reach forward >12 cm safely (5")   From Standing Position, Pick up Object from Floor Able to pick up shoe, needs supervision   From Standing Position, Turn to Look Behind Over each Shoulder Looks behind one side only/other side shows less weight shift   Turn 360 Degrees Able to turn 360 degrees safely but slowly   Standing Unsupported, Alternately Place Feet on Step/Stool Needs assistance to keep from falling or unable to try   Standing Unsupported, One Foot in Front Needs help to step but can hold 15 seconds   Standing on One Leg Tries to lift leg/unable to hold 3 seconds but remains standing independently   Total Score 41                             PT Education - 06/02/16 1223    Education provided Yes   Education Details progress with skilled PT services, POC moving forward    Person(s) Educated Patient   Methods Explanation   Comprehension Verbalized understanding          PT Short Term Goals - 06/02/16 1143      PT SHORT TERM GOAL #1   Title Patient to be able to ambulate 71f during 3MWT with rollator in order to demonstrate improved mobilty and functional activity tolerance    Baseline 8/28- 4914fwith rollator    Time 3   Period Weeks   Status On-going     PT SHORT TERM GOAL #2   Title Patient to be able to identify 5/5 safety items for use at home and community in order to demonstrate improved safety awareness/reduced fall risk    Baseline 8/28- able to name 3/5, requires assist for others    Time 3   Period Weeks   Status On-going     PT SHORT TERM GOAL #3   Title Patient to score at least 44 on the BERG in order to demonstrate overall reduced fall risk    Baseline 8/28- 41    Time 3   Period Weeks   Status On-going     PT SHORT TERM GOAL #4   Title Patient to be able to consistently and correctly perform appropriate HEP, to be updated PRN    Time 3   Period Weeks   Status Achieved           PT Long  Term Goals - 06/02/16 1147      PT LONG TERM GOAL #1   Title Patient to score MMT 5/5 in all tested groups in order to demonstrate improved strength for  functional mobility based tasks    Baseline 06/19/2023- improving, proximal remains weak   Time 6   Period Weeks   Status On-going     PT LONG TERM GOAL #2   Title Patient to score at least a 50 on BERG balance test in order to demonstrate reduced overall fall risk    Baseline June 19, 2023- 41    Time 6   Period Weeks   Status On-going     PT LONG TERM GOAL #3   Title Patient to be able to safely ascend/descend at least 4 stairs with U railing, good motor control, and minimal unsteadiness in order to improve overall mobility    Time 6   Period Weeks   Status Partially Met     PT LONG TERM GOAL #4   Title Patient to be participatory in regular exercise, at least 20 minutes at a time, at least 4 days per week, in order to maintain functional gains and improve overall health status    Time 6   Period Weeks   Status On-going               Plan - 18-Jun-2016 1224    Clinical Impression Statement Re-assessment performed today. Patient reveals some improvement in gait mechanics/speed, improvement in balance, and also some improvement in functional strength at this time; patient does state some concern that she is not as far along as she thought she'd be by now however after educating and reviewing progress she appears satisfied with the gains she has made thus far. However, due to ongoing functional weakness, reduced functional activity tolerance, unsteadiness, and overall being an on-going fall risk, patient will benefit from an extension of skilled PT services in order to continue to address functional deficits and optimize overall level of function.    Rehab Potential Good   PT Frequency 3x / week   PT Duration 3 weeks   PT Treatment/Interventions ADLs/Self Care Home Management;Biofeedback;DME Instruction;Gait training;Stair training;Functional  mobility training;Therapeutic activities;Therapeutic exercise;Balance training;Neuromuscular re-education;Patient/family education;Manual techniques;Passive range of motion;Energy conservation;Taping   PT Next Visit Plan focus on proximal and CKC strength with correct alignment; continue focus on static and dynamic balance; Nustep. Gait with no device.    PT Home Exercise Plan assigned to patient including TANDEM STANCE, SLS, SUPINE BRIDGE, sidelying hip abduction and prone hip extension   No additional exercises given this session.   Consulted and Agree with Plan of Care Patient      Patient will benefit from skilled therapeutic intervention in order to improve the following deficits and impairments:  Abnormal gait, Improper body mechanics, Postural dysfunction, Decreased activity tolerance, Decreased strength, Decreased balance, Decreased safety awareness, Difficulty walking, Impaired flexibility  Visit Diagnosis: Muscle weakness (generalized)  Unsteadiness on feet  Difficulty in walking, not elsewhere classified       G-Codes - 06/18/16 1228    Functional Assessment Tool Used Based on skilled clinical assessment of strength, gait, balance    Functional Limitation Mobility: Walking and moving around   Mobility: Walking and Moving Around Current Status [phone]) At least 40 percent but less than 60 percent impaired, limited or restricted   Mobility: Walking and Moving Around Goal Status [phone]) At least 20 percent but less than 40 percent impaired, limited or restricted      Problem List Patient Active Problem List   Diagnosis Date Noted  . Pressure ulcer 05/01/2016  . Hyperglycemia 05/01/2016  . Hyperlipidemia 05/01/2016  . Stroke (HCC) 04/30/2016  . Right sided weakness  04/30/2016  . Essential hypertension 04/30/2016  . Hypothyroidism 04/30/2016    Deniece Ree PT, DPT [id number]  [facility] [address] [place], [place],  [address] Phone: [phone]   Fax:  [phone]  Name: JAKERA BEAUPRE MRN: [id number] Date of Birth: 05-Nov-1924

## 2016-06-02 NOTE — Progress Notes (Signed)
Loop check in clinic. Steri-strips removed. Incision edges approximated. Wound without redness, swelling or drainage. Patient educated about wound care and Carelink monitoring. Battery status: good. R-waves 0.4564mV. No episodes detected. Brady and pause detection programmed off. Monthly summary reports and ROV with WC PRN.

## 2016-06-04 ENCOUNTER — Ambulatory Visit (HOSPITAL_COMMUNITY): Payer: Medicare Other | Admitting: Physical Therapy

## 2016-06-04 DIAGNOSIS — M6281 Muscle weakness (generalized): Secondary | ICD-10-CM

## 2016-06-04 DIAGNOSIS — R262 Difficulty in walking, not elsewhere classified: Secondary | ICD-10-CM

## 2016-06-04 DIAGNOSIS — R2681 Unsteadiness on feet: Secondary | ICD-10-CM

## 2016-06-04 NOTE — Therapy (Signed)
[facility] [place], [place], [address] Phone: [phone]   Fax:  [phone]  Physical Therapy Treatment  Patient Details  Name: Nancy [name] MRN: [id number] Date of Birth: 12-27-1924 Referring Provider: Asencion Noble   Encounter Date: 06/04/2016      PT End of Session - 06/04/16 1209    Visit Number 10   Number of Visits 18   Date for PT Re-Evaluation 06/23/16   Authorization Type [employer] Medicare (G-codes done 9th session)   Authorization Time Period 05/12/16 to 06/23/16   Authorization - Visit Number 10   Authorization - Number of Visits 19   PT Start Time 9937   PT Stop Time 1156   PT Time Calculation (min) 40 min   Equipment Utilized During Treatment Gait belt   Activity Tolerance Patient tolerated treatment well   Behavior During Therapy [facility] for tasks assessed/performed      Past Medical History:  Diagnosis Date  . DVT (deep venous thrombosis) ([place])    a. left lower extremity 07/2015.  . Glaucoma   . Hypertension   . Hypothyroidism   . Legally blind     Past Surgical History:  Procedure Laterality Date  . EP IMPLANTABLE DEVICE N/A 05/20/2016   Procedure: Loop Recorder Insertion;  Surgeon: Will Meredith Leeds, MD;  Location: [place] CV LAB;  Service: Cardiovascular;  Laterality: N/A;  . NASAL SINUS SURGERY    . SPINE SURGERY    . TEE WITHOUT CARDIOVERSION N/A 05/20/2016   Procedure: TRANSESOPHAGEAL ECHOCARDIOGRAM (TEE) POSSIBLE LOOP RECORDER IMPLANTATION;  Surgeon: Thayer Headings, MD;  Location: [place];  Service: Cardiovascular;  Laterality: N/A;    There were no vitals filed for this visit.      Subjective Assessment - 06/04/16 1207    Subjective Patient arrives today reporting she is feeling well, no major changes since last time.    Currently in Pain? No/denies                         [facility] Adult PT Treatment/Exercise - 06/04/16 0001      Ambulation/Gait   Ambulation/Gait Yes   Ambulation/Gait Assistance 4: Min guard   Ambulation Distance (Feet) --  213f x4    Assistive device None   Gait Comments occasional cues for increased BOS              Balance Exercises - 06/04/16 1208      Balance Exercises: Standing   Other Standing Exercises cone rotations with cross-midline reach on solid surface; cone taps on horizontal and vertical planes, min guard            PT Education - 06/04/16 1209    Education provided No          PT Short Term Goals - 06/02/16 1143      PT SHORT TERM GOAL #1   Title Patient to be able to ambulate 7026fduring 3MWT with rollator in order to demonstrate improved mobilty and functional activity tolerance    Baseline 8/28- 49374fith rollator    Time 3   Period Weeks   Status On-going     PT SHORT TERM GOAL #2   Title Patient to be able to identify 5/5 safety items for use at home and community in order to demonstrate improved safety awareness/reduced fall risk    Baseline 8/28- able to name 3/5, requires assist for others    Time 3  Period Weeks   Status On-going     PT SHORT TERM GOAL #3   Title Patient to score at least 44 on the BERG in order to demonstrate overall reduced fall risk    Baseline 8/28- 41    Time 3   Period Weeks   Status On-going     PT SHORT TERM GOAL #4   Title Patient to be able to consistently and correctly perform appropriate HEP, to be updated PRN    Time 3   Period Weeks   Status Achieved           PT Long Term Goals - 06/02/16 1147      PT LONG TERM GOAL #1   Title Patient to score MMT 5/5 in all tested groups in order to demonstrate improved strength for functional mobility based tasks    Baseline 8/28- improving, proximal remains weak   Time 6   Period Weeks   Status On-going     PT LONG TERM GOAL #2   Title Patient to score at least a 50 on BERG balance test in order to demonstrate reduced overall fall risk    Baseline 8/28- 41    Time 6    Period Weeks   Status On-going     PT LONG TERM GOAL #3   Title Patient to be able to safely ascend/descend at least 4 stairs with U railing, good motor control, and minimal unsteadiness in order to improve overall mobility    Time 6   Period Weeks   Status Partially Met     PT LONG TERM GOAL #4   Title Patient to be participatory in regular exercise, at least 20 minutes at a time, at least 4 days per week, in order to maintain functional gains and improve overall health status    Time 6   Period Weeks   Status On-going               Plan - 06/04/16 1210    Clinical Impression Statement Focused primarily on gait without assistive device and balance based tasks today, with min guard to Min(A) provided throughout session. Patient able to perform cross-midline activities with good BOS with relative ease, however did display difficulty with SLS during cone tap activities, likely due to combination of functional weakness and difficulty with more advanced coordination activities. Patient tends to display increased drift/reduced BOS during gait as fatigue increases, also requires increased assist with balance/coordination tasks as fatigue increases today. Recommend continued focus on advanced balance tasks.    Rehab Potential Good   PT Frequency 3x / week   PT Duration 3 weeks   PT Treatment/Interventions ADLs/Self Care Home Management;Biofeedback;DME Instruction;Gait training;Stair training;Functional mobility training;Therapeutic activities;Therapeutic exercise;Balance training;Neuromuscular re-education;Patient/family education;Manual techniques;Passive range of motion;Energy conservation;Taping   PT Next Visit Plan focus on proximal and CKC strength with correct alignment; advanced balance/coordination activities; Nustep. Gait with no device.    Consulted and Agree with Plan of Care Patient      Patient will benefit from skilled therapeutic intervention in order to improve the  following deficits and impairments:  Abnormal gait, Improper body mechanics, Postural dysfunction, Decreased activity tolerance, Decreased strength, Decreased balance, Decreased safety awareness, Difficulty walking, Impaired flexibility  Visit Diagnosis: Muscle weakness (generalized)  Unsteadiness on feet  Difficulty in walking, not elsewhere classified     Problem List Patient Active Problem List   Diagnosis Date Noted  . Pressure ulcer 05/01/2016  . Hyperglycemia 05/01/2016  . Hyperlipidemia 05/01/2016  .  Stroke ([place]) 04/30/2016  . Right sided weakness 04/30/2016  . Essential hypertension 04/30/2016  . Hypothyroidism 04/30/2016    Deniece Ree PT, DPT [phone]  [facility] [address] [place], [place], [address] Phone: [phone]   Fax:  [phone]  Name: Nancy [name] MRN: [id number] Date of Birth: [birth year]

## 2016-06-06 ENCOUNTER — Ambulatory Visit (HOSPITAL_COMMUNITY): Payer: Medicare Other | Attending: Internal Medicine

## 2016-06-06 DIAGNOSIS — M6281 Muscle weakness (generalized): Secondary | ICD-10-CM | POA: Diagnosis present

## 2016-06-06 DIAGNOSIS — R2681 Unsteadiness on feet: Secondary | ICD-10-CM | POA: Diagnosis present

## 2016-06-06 DIAGNOSIS — R262 Difficulty in walking, not elsewhere classified: Secondary | ICD-10-CM | POA: Insufficient documentation

## 2016-06-06 NOTE — Therapy (Signed)
[facility] [place], [place], [address] Phone: [phone]   Fax:  [phone]  Physical Therapy Treatment  Patient Details  Name: Nancy [name] MRN: [id number] Date of Birth: April 05, 1925 Referring Provider: Asencion Noble   Encounter Date: 06/06/2016      PT End of Session - 06/06/16 1125    Visit Number 11   Number of Visits 18   Date for PT Re-Evaluation 06/23/16   Authorization Type [employer] Medicare (G-codes done 9th session)   Authorization Time Period 05/12/16 to 06/23/16   Authorization - Visit Number 11   Authorization - Number of Visits 19   PT Start Time 1120   PT Stop Time 1208   PT Time Calculation (min) 48 min   Equipment Utilized During Treatment Gait belt   Activity Tolerance Patient tolerated treatment well   Behavior During Therapy [facility] for tasks assessed/performed      Past Medical History:  Diagnosis Date  . DVT (deep venous thrombosis) ([place])    a. left lower extremity 07/2015.  . Glaucoma   . Hypertension   . Hypothyroidism   . Legally blind     Past Surgical History:  Procedure Laterality Date  . EP IMPLANTABLE DEVICE N/A 05/20/2016   Procedure: Loop Recorder Insertion;  Surgeon: Will Meredith Leeds, MD;  Location: [place] CV LAB;  Service: Cardiovascular;  Laterality: N/A;  . NASAL SINUS SURGERY    . SPINE SURGERY    . TEE WITHOUT CARDIOVERSION N/A 05/20/2016   Procedure: TRANSESOPHAGEAL ECHOCARDIOGRAM (TEE) POSSIBLE LOOP RECORDER IMPLANTATION;  Surgeon: Thayer Headings, MD;  Location: [place];  Service: Cardiovascular;  Laterality: N/A;    There were no vitals filed for this visit.      Subjective Assessment - 06/06/16 1118    Subjective Pt stated she is doing well today, no reports of pain.  Has been compliant with HEP daily.  Still continues to have difficulty with balance activities at home, no reports of recent fall.   Pertinent History HTN, CVA, hypothyroidism, hx of DVT, glaucoma,  legally blind, multiple surgeries for a ruptured disc and a bulging disc    Patient Stated Goals to get stronger, walk better, get balance better    Currently in Pain? No/denies                         [facility] Adult PT Treatment/Exercise - 06/06/16 0001      Ambulation/Gait   Ambulation/Gait Yes   Ambulation/Gait Assistance 4: Min guard   Ambulation Distance (Feet) 256 Feet   Assistive device None   Gait Comments occasional cues for increased BOS      Knee/Hip Exercises: Aerobic   Nustep level 3, hills 2, x8 minutes UE and LE; unsupervised at EOS, no charge             Balance Exercises - 06/06/16 1226      Balance Exercises: Standing   Tandem Stance Eyes open;3 reps  with head movements   SLS 3 reps  Lt 6", Rt 5"   Tandem Gait Forward;1 rep   Sidestepping 2 reps;Theraband  RTB   Step Over Hurdles / Cones 6 and 12 in alternating with 5" holds SLS   Cone Rotation Foam/compliant surface;R/L   Cone Rotation Limitations NBOS on airex   Other Standing Exercises cone taps on horizontal and vertical plans             PT Short  Term Goals - 06/02/16 1143      PT SHORT TERM GOAL #1   Title Patient to be able to ambulate 722ft during with rollator in order to demonstrate improved mobilty and functional activity tolerance    Baseline 8/28- 477ft with rollator    Time 3   Period Weeks   Status On-going     PT SHORT TERM GOAL #2   Title Patient to be able to identify 5/5 safety items for use at home and community in order to demonstrate improved safety awareness/reduced fall risk    Baseline 8/28- able to name 3/5, requires assist for others    Time 3   Period Weeks   Status On-going     PT SHORT TERM GOAL #3   Title Patient to score at least 44 on the BERG in order to demonstrate overall reduced fall risk    Baseline 8/28- 41    Time 3   Period Weeks   Status On-going     PT SHORT TERM GOAL #4   Title Patient to be able to consistently and  correctly perform appropriate HEP, to be updated PRN    Time 3   Period Weeks   Status Achieved           PT Long Term Goals - 06/02/16 1147      PT LONG TERM GOAL #1   Title Patient to score MMT 5/5 in all tested groups in order to demonstrate improved strength for functional mobility based tasks    Baseline 8/28- improving, proximal remains weak   Time 6   Period Weeks   Status On-going     PT LONG TERM GOAL #2   Title Patient to score at least a 50 on BERG balance test in order to demonstrate reduced overall fall risk    Baseline 8/28- 41    Time 6   Period Weeks   Status On-going     PT LONG TERM GOAL #3   Title Patient to be able to safely ascend/descend at least 4 stairs with U railing, good motor control, and minimal unsteadiness in order to improve overall mobility    Time 6   Period Weeks   Status Partially Met     PT LONG TERM GOAL #4   Title Patient to be participatory in regular exercise, at least 20 minutes at a time, at least 4 days per week, in order to maintain functional gains and improve overall health status    Time 6   Period Weeks   Status On-going               Plan - 06/06/16 1229    Clinical Impression Statement Session focus on progressing balance training especially with SLS activities.  Added hurdles to improve SLS with 5" holds requiring min guard to min A with balance training.  Gait training complete with no AD with minimal LOB epsides, able to recover with CGA for safety.  Ended session with nustep for activity tolerance, no reports of pain through session was limited by fatigue.     Rehab Potential Good   PT Frequency 3x / week   PT Duration 3 weeks   PT Treatment/Interventions ADLs/Self Care Home Management;Biofeedback;DME Instruction;Gait training;Stair training;Functional mobility training;Therapeutic activities;Therapeutic exercise;Balance training;Neuromuscular re-education;Patient/family education;Manual techniques;Passive  range of motion;Energy conservation;Taping   PT Next Visit Plan focus on proximal and CKC strength with correct alignment; advanced balance/coordination activities; Nustep. Gait with no device.    PT Home  Exercise Plan assigned to patient including TANDEM STANCE, SLS, SUPINE BRIDGE, sidelying hip abduction and prone hip extension   No additional exercises given this session.      Patient will benefit from skilled therapeutic intervention in order to improve the following deficits and impairments:  Abnormal gait, Improper body mechanics, Postural dysfunction, Decreased activity tolerance, Decreased strength, Decreased balance, Decreased safety awareness, Difficulty walking, Impaired flexibility  Visit Diagnosis: Muscle weakness (generalized)  Unsteadiness on feet  Difficulty in walking, not elsewhere classified     Problem List Patient Active Problem List   Diagnosis Date Noted  . Pressure ulcer 05/01/2016  . Hyperglycemia 05/01/2016  . Hyperlipidemia 05/01/2016  . Stroke ([place]) 04/30/2016  . Right sided weakness 04/30/2016  . Essential hypertension 04/30/2016  . Hypothyroidism 04/30/2016   Ihor Austin, [place]; [place]  Aldona Lento 06/06/2016, 12:36 PM  [facility] [address] [place], [place], [address] Phone: [phone]   Fax:  [phone]  Name: VERDENE CRESON MRN: [id number] Date of Birth: [birth year]

## 2016-06-10 ENCOUNTER — Ambulatory Visit (HOSPITAL_COMMUNITY): Payer: Medicare Other | Admitting: Physical Therapy

## 2016-06-10 DIAGNOSIS — R262 Difficulty in walking, not elsewhere classified: Secondary | ICD-10-CM

## 2016-06-10 DIAGNOSIS — R2681 Unsteadiness on feet: Secondary | ICD-10-CM

## 2016-06-10 DIAGNOSIS — M6281 Muscle weakness (generalized): Secondary | ICD-10-CM | POA: Diagnosis not present

## 2016-06-10 NOTE — Therapy (Signed)
[place], [place], [address] Phone: [phone]   Fax:  [phone]  Physical Therapy Treatment  Patient Details  Name: Nancy [name] MRN: [id number] Date of Birth: September 10, 1925 Referring Provider: Asencion Noble   Encounter Date: 06/10/2016      PT End of Session - 06/10/16 1205    Visit Number 12   Number of Visits 18   Date for PT Re-Evaluation 06/23/16   Authorization Type [employer] Medicare (G-codes done 9th session)   Authorization Time Period 05/12/16 to 06/23/16   Authorization - Visit Number 12   Authorization - Number of Visits 19   PT Start Time 7408   PT Stop Time 1158   PT Time Calculation (min) 42 min   Equipment Utilized During Treatment Gait belt   Activity Tolerance Patient tolerated treatment well   Behavior During Therapy [facility] for tasks assessed/performed      Past Medical History:  Diagnosis Date  . DVT (deep venous thrombosis) ([place])    a. left lower extremity 07/2015.  . Glaucoma   . Hypertension   . Hypothyroidism   . Legally blind     Past Surgical History:  Procedure Laterality Date  . EP IMPLANTABLE DEVICE N/A 05/20/2016   Procedure: Loop Recorder Insertion;  Surgeon: Will Meredith Leeds, MD;  Location: [place] [facility];  Service: Cardiovascular;  Laterality: N/A;  . NASAL SINUS SURGERY    . SPINE SURGERY    . TEE WITHOUT CARDIOVERSION N/A 05/20/2016   Procedure: TRANSESOPHAGEAL ECHOCARDIOGRAM (TEE) POSSIBLE LOOP RECORDER IMPLANTATION;  Surgeon: Thayer Headings, MD;  Location: [place];  Service: Cardiovascular;  Laterality: N/A;    There were no vitals filed for this visit.      Subjective Assessment - 06/10/16 1202    Subjective Patient states she is doing well, no major changes since last session but she does report that balance is still the hardest thing for her right now    Pertinent History HTN, CVA, hypothyroidism, hx of DVT, glaucoma, legally blind, multiple surgeries for  a ruptured disc and a bulging disc    Currently in Pain? No/denies                         [facility] Adult PT Treatment/Exercise - 06/10/16 0001      Ambulation/Gait   Ambulation/Gait Yes   Ambulation/Gait Assistance 4: Min guard   Ambulation Distance (Feet) --  475f x3    Assistive device None   Gait Comments occasional cues for increased BOS      Knee/Hip Exercises: Standing   Lateral Step Up Both;1 set;15 reps   Lateral Step Up Limitations 4 inch box, manual cues for form    Other Standing Knee Exercises stadning hip flexion with no UEs, core activation x10 each side              Balance Exercises - 06/10/16 1204      Balance Exercises: Standing   Rockerboard Anterior/posterior;Lateral;EO;Intermittent UE support   Other Standing Exercises cone taps on horizontal and vertical plans; navigation of obstacle course invollving navigation of cones and hurdles   min guard            PT Education - 06/10/16 1205    Education provided No          PT Short Term Goals - 06/02/16 1143      PT SHORT TERM GOAL #1   Title Patient  to be able to ambulate 770ft during with rollator in order to demonstrate improved mobilty and functional activity tolerance    Baseline 8/28- 447ft with rollator    Time 3   Period Weeks   Status On-going     PT SHORT TERM GOAL #2   Title Patient to be able to identify 5/5 safety items for use at home and community in order to demonstrate improved safety awareness/reduced fall risk    Baseline 8/28- able to name 3/5, requires assist for others    Time 3   Period Weeks   Status On-going     PT SHORT TERM GOAL #3   Title Patient to score at least 44 on the BERG in order to demonstrate overall reduced fall risk    Baseline 8/28- 41    Time 3   Period Weeks   Status On-going     PT SHORT TERM GOAL #4   Title Patient to be able to consistently and correctly perform appropriate HEP, to be updated PRN    Time 3   Period  Weeks   Status Achieved           PT Long Term Goals - 06/02/16 1147      PT LONG TERM GOAL #1   Title Patient to score MMT 5/5 in all tested groups in order to demonstrate improved strength for functional mobility based tasks    Baseline 8/28- improving, proximal remains weak   Time 6   Period Weeks   Status On-going     PT LONG TERM GOAL #2   Title Patient to score at least a 50 on BERG balance test in order to demonstrate reduced overall fall risk    Baseline 8/28- 41    Time 6   Period Weeks   Status On-going     PT LONG TERM GOAL #3   Title Patient to be able to safely ascend/descend at least 4 stairs with U railing, good motor control, and minimal unsteadiness in order to improve overall mobility    Time 6   Period Weeks   Status Partially Met     PT LONG TERM GOAL #4   Title Patient to be participatory in regular exercise, at least 20 minutes at a time, at least 4 days per week, in order to maintain functional gains and improve overall health status    Time 6   Period Weeks   Status On-going               Plan - 06/10/16 1206    Clinical Impression Statement Continued focus on advanced dynamic balance tasks as well as functional strengthening and gait without assistive device today; patient does continue to be challenged by balance activities, especially SLS based tasks today, partially likely due to ongoing chronic weakness in bilateral hip abductors and proximal musculature. Minimal rest breaks provided and fatigue noted by end of session. Patient continues to do very well overall and remains motivated and participatory with skilled PT services.    Rehab Potential Good   PT Frequency 3x / week   PT Duration 3 weeks   PT Treatment/Interventions ADLs/Self Care Home Management;Biofeedback;DME Instruction;Gait training;Stair training;Functional mobility training;Therapeutic activities;Therapeutic exercise;Balance training;Neuromuscular  re-education;Patient/family education;Manual techniques;Passive range of motion;Energy conservation;Taping   PT Next Visit Plan focus on proximal and CKC strength with correct alignment; advanced balance/coordination activities; Nustep. Gait with no device.    PT Home Exercise Plan assigned to patient including TANDEM STANCE, SLS, SUPINE BRIDGE,  sidelying hip abduction and prone hip extension   No additional exercises given this session.   Consulted and Agree with Plan of Care Patient      Patient will benefit from skilled therapeutic intervention in order to improve the following deficits and impairments:  Abnormal gait, Improper body mechanics, Postural dysfunction, Decreased activity tolerance, Decreased strength, Decreased balance, Decreased safety awareness, Difficulty walking, Impaired flexibility  Visit Diagnosis: Muscle weakness (generalized)  Unsteadiness on feet  Difficulty in walking, not elsewhere classified     Problem List Patient Active Problem List   Diagnosis Date Noted  . Pressure ulcer 05/01/2016  . Hyperglycemia 05/01/2016  . Hyperlipidemia 05/01/2016  . Stroke ([place]) 04/30/2016  . Right sided weakness 04/30/2016  . Essential hypertension 04/30/2016  . Hypothyroidism 04/30/2016    Deniece Ree PT, DPT [phone]  [place] [address] [place], [place], [address] Phone: [phone]   Fax:  [phone]  Name: Nancy [name] MRN: [id number] Date of Birth: Dec 07, 1924

## 2016-06-11 ENCOUNTER — Ambulatory Visit (HOSPITAL_COMMUNITY): Payer: Medicare Other | Admitting: Physical Therapy

## 2016-06-11 DIAGNOSIS — M6281 Muscle weakness (generalized): Secondary | ICD-10-CM

## 2016-06-11 DIAGNOSIS — R262 Difficulty in walking, not elsewhere classified: Secondary | ICD-10-CM

## 2016-06-11 DIAGNOSIS — R2681 Unsteadiness on feet: Secondary | ICD-10-CM

## 2016-06-11 NOTE — Therapy (Signed)
[place], [place], [address] Phone: [phone]   Fax:  [phone]  Physical Therapy Treatment  Patient Details  Name: Nancy [name] MRN: [id number] Date of Birth: Oct 11, 1924 Referring Provider: Asencion Noble   Encounter Date: 06/11/2016      PT End of Session - 06/11/16 1211    Visit Number 13   Number of Visits 18   Date for PT Re-Evaluation 06/23/16   Authorization Type [employer] Medicare (G-codes done 9th session)   Authorization Time Period 05/12/16 to 06/23/16   Authorization - Visit Number 13   Authorization - Number of Visits 19   PT Start Time 4742   PT Stop Time 1158   PT Time Calculation (min) 40 min   Equipment Utilized During Treatment Gait belt   Activity Tolerance Patient tolerated treatment well   Behavior During Therapy [facility] for tasks assessed/performed      Past Medical History:  Diagnosis Date  . DVT (deep venous thrombosis) ([place])    a. left lower extremity 07/2015.  . Glaucoma   . Hypertension   . Hypothyroidism   . Legally blind     Past Surgical History:  Procedure Laterality Date  . EP IMPLANTABLE DEVICE N/A 05/20/2016   Procedure: Loop Recorder Insertion;  Surgeon: Will Meredith Leeds, MD;  Location: [place] [facility];  Service: Cardiovascular;  Laterality: N/A;  . NASAL SINUS SURGERY    . SPINE SURGERY    . TEE WITHOUT CARDIOVERSION N/A 05/20/2016   Procedure: TRANSESOPHAGEAL ECHOCARDIOGRAM (TEE) POSSIBLE LOOP RECORDER IMPLANTATION;  Surgeon: Thayer Headings, MD;  Location: [place];  Service: Cardiovascular;  Laterality: N/A;    There were no vitals filed for this visit.      Subjective Assessment - 06/11/16 1121    Subjective (P)  patient arrives tdoay stating she is doing well, but having some pain and stiffness with her R hand today; she took some tylenol and it is helping. It has happened before but she cannot remember how long it took to go away.    Pertinent History (P)   HTN, CVA, hypothyroidism, hx of DVT, glaucoma, legally blind, multiple surgeries for a ruptured disc and a bulging disc    Patient Stated Goals (P)  to get stronger, walk better, get balance better    Currently in Pain? (P)  Yes   Pain Score (P)  8    Pain Location (P)  Hand   Pain Orientation (P)  Right   Pain Descriptors / Indicators (P)  Sore   Pain Type (P)  Acute pain   Pain Radiating Towards (P)  none    Pain Onset (P)  Yesterday   Pain Frequency (P)  Intermittent   Aggravating Factors  (P)  grabbing items with hand    Pain Relieving Factors (P)  opening hand                          [facility] Adult PT Treatment/Exercise - 06/11/16 0001      Ambulation/Gait   Ambulation/Gait Yes   Ambulation/Gait Assistance 4: Min guard   Ambulation Distance (Feet) --  481f x3   Assistive device None   Gait Comments occasional unsteadiness noted especially on turns              Balance Exercises - 06/11/16 1208      Balance Exercises: Standing   Rockerboard Anterior/posterior;Lateral;EO;Intermittent UE support   Tandem Gait  Forward;4 reps   Turning Both;Other reps (comment);Other (comment)  x4 rounds    Other Standing Exercises forwards/backwards gait with ball toss, min guard            PT Education - 06/11/16 1211    Education provided No          PT Short Term Goals - 06/02/16 1143      PT SHORT TERM GOAL #1   Title Patient to be able to ambulate 752f during 3MWT with rollator in order to demonstrate improved mobilty and functional activity tolerance    Baseline 8/28- 4921fwith rollator    Time 3   Period Weeks   Status On-going     PT SHORT TERM GOAL #2   Title Patient to be able to identify 5/5 safety items for use at home and community in order to demonstrate improved safety awareness/reduced fall risk    Baseline 8/28- able to name 3/5, requires assist for others    Time 3   Period Weeks   Status On-going     PT SHORT TERM GOAL #3    Title Patient to score at least 44 on the BERG in order to demonstrate overall reduced fall risk    Baseline 8/28- 41    Time 3   Period Weeks   Status On-going     PT SHORT TERM GOAL #4   Title Patient to be able to consistently and correctly perform appropriate HEP, to be updated PRN    Time 3   Period Weeks   Status Achieved           PT Long Term Goals - 06/02/16 1147      PT LONG TERM GOAL #1   Title Patient to score MMT 5/5 in all tested groups in order to demonstrate improved strength for functional mobility based tasks    Baseline 8/28- improving, proximal remains weak   Time 6   Period Weeks   Status On-going     PT LONG TERM GOAL #2   Title Patient to score at least a 50 on BERG balance test in order to demonstrate reduced overall fall risk    Baseline 8/28- 41    Time 6   Period Weeks   Status On-going     PT LONG TERM GOAL #3   Title Patient to be able to safely ascend/descend at least 4 stairs with U railing, good motor control, and minimal unsteadiness in order to improve overall mobility    Time 6   Period Weeks   Status Partially Met     PT LONG TERM GOAL #4   Title Patient to be participatory in regular exercise, at least 20 minutes at a time, at least 4 days per week, in order to maintain functional gains and improve overall health status    Time 6   Period Weeks   Status On-going               Plan - 06/11/16 1212    Clinical Impression Statement Continued working on gait without assistive device, as well as ongoing training with dynamic balance tasks this session. Continue to note some unsteadiness with gait with no device especially around corners but is aware of safety precautions including not rushing and making sure she has her balance before continuing. She also continues to have difficulty with balance tasks involving SLS based tasks, such as hurdles, but is showing slow improvement. Fatigue noted at end of session.  Rehab Potential  Good   PT Frequency 3x / week   PT Duration 3 weeks   PT Treatment/Interventions ADLs/Self Care Home Management;Biofeedback;DME Instruction;Gait training;Stair training;Functional mobility training;Therapeutic activities;Therapeutic exercise;Balance training;Neuromuscular re-education;Patient/family education;Manual techniques;Passive range of motion;Energy conservation;Taping   PT Next Visit Plan focus on proximal and CKC strength with correct alignment; advanced balance/coordination activities; Nustep. Gait with no device.    PT Home Exercise Plan assigned to patient including TANDEM STANCE, SLS, SUPINE BRIDGE, sidelying hip abduction and prone hip extension   No additional exercises given this session.   Consulted and Agree with Plan of Care Patient      Patient will benefit from skilled therapeutic intervention in order to improve the following deficits and impairments:  Abnormal gait, Improper body mechanics, Postural dysfunction, Decreased activity tolerance, Decreased strength, Decreased balance, Decreased safety awareness, Difficulty walking, Impaired flexibility  Visit Diagnosis: Muscle weakness (generalized)  Unsteadiness on feet  Difficulty in walking, not elsewhere classified     Problem List Patient Active Problem List   Diagnosis Date Noted  . Pressure ulcer 05/01/2016  . Hyperglycemia 05/01/2016  . Hyperlipidemia 05/01/2016  . Stroke ([place]) 04/30/2016  . Right sided weakness 04/30/2016  . Essential hypertension 04/30/2016  . Hypothyroidism 04/30/2016    Deniece Ree PT, DPT [phone]  [place] [address] [place], [place], [address] Phone: [phone]   Fax:  [phone]  Name: Nancy [name] MRN: [id number] Date of Birth: 05/29/1925

## 2016-06-13 ENCOUNTER — Ambulatory Visit (HOSPITAL_COMMUNITY): Payer: Medicare Other | Admitting: Physical Therapy

## 2016-06-13 DIAGNOSIS — M6281 Muscle weakness (generalized): Secondary | ICD-10-CM

## 2016-06-13 DIAGNOSIS — R2681 Unsteadiness on feet: Secondary | ICD-10-CM

## 2016-06-13 DIAGNOSIS — R262 Difficulty in walking, not elsewhere classified: Secondary | ICD-10-CM

## 2016-06-13 NOTE — Therapy (Signed)
[facility] [address] [place], [place], [address] Phone: [phone]   Fax:  [phone]  Physical Therapy Treatment  Patient Details  Name: Nancy [name] MRN: [id number] Date of Birth: April 08, 1925 Referring Provider: Carylon Perches   Encounter Date: 06/13/2016      PT End of Session - 06/13/16 1205    Visit Number 14   Number of Visits 18   Date for PT Re-Evaluation 06/23/16   Authorization Type [employer] Medicare (G-codes done 9th session)   Authorization Time Period 05/12/16 to 06/23/16   Authorization - Visit Number 14   Authorization - Number of Visits 19   PT Start Time 1116   PT Stop Time 1156   PT Time Calculation (min) 40 min   Equipment Utilized During Treatment Gait belt   Activity Tolerance Patient tolerated treatment well   Behavior During Therapy [facility] for tasks assessed/performed      Past Medical History:  Diagnosis Date  . DVT (deep venous thrombosis) ([facility])    a. left lower extremity 07/2015.  . Glaucoma   . Hypertension   . Hypothyroidism   . Legally blind     Past Surgical History:  Procedure Laterality Date  . EP IMPLANTABLE DEVICE N/A 05/20/2016   Procedure: Loop Recorder Insertion;  Surgeon: Will Jorja Loa, MD;  [facility]: [facility];  Service: Cardiovascular;  Laterality: N/A;  . NASAL SINUS SURGERY    . SPINE SURGERY    . TEE WITHOUT CARDIOVERSION N/A 05/20/2016   Procedure: TRANSESOPHAGEAL ECHOCARDIOGRAM (TEE) POSSIBLE LOOP RECORDER IMPLANTATION;  Surgeon: Vesta Mixer, MD;  Location: [facility];  Service: Cardiovascular;  Laterality: N/A;    There were no vitals filed for this visit.      Subjective Assessment - 06/13/16 1116    Subjective Pt reports things are going well. She has no complaints of pain or anything else at this time.    Currently in Pain? No/denies                         [facility] Adult PT Treatment/Exercise - 06/13/16 0001      Knee/Hip Exercises:  Standing   Heel Raises Both;1 set;20 reps   Heel Raises Limitations toe raises   Forward Step Up Both;2 sets;10 reps;Step Height: 4"  with foam pad on step, x1 set with RLE   Functional Squat 2 sets;15 reps   Functional Squat Limitations verbal cues to decrease knee valgus    Gait Training no AD, x225ft with commands stop/go/turn, x200 ft with commands fast/slow gait, CGA and x2 LOB noted with turns along with report of dizziness              Balance Exercises - 06/13/16 1134      Balance Exercises: Standing   SLS Eyes open;3 reps;20 secs  contralat LE propped on volleyball, CGA   Rockerboard Anterior/posterior;Lateral;EO;Intermittent UE support;Other (comment)  increased difficutly with A/P, CGA x2 min each    Tandem Gait Forward;4 reps  CGA           PT Education - 06/13/16 1202    Education provided Yes   Education Details importance of functional strength/endurance involvement with balance    Person(s) Educated Patient   Methods Explanation   Comprehension Verbalized understanding          PT Short Term Goals - 06/02/16 1143      PT SHORT TERM GOAL #1   Title Patient  to be able to ambulate 744f during 3MWT with rollator in order to demonstrate improved mobilty and functional activity tolerance    Baseline 8/28- 4934fwith rollator    Time 3   Period Weeks   Status On-going     PT SHORT TERM GOAL #2   Title Patient to be able to identify 5/5 safety items for use at home and community in order to demonstrate improved safety awareness/reduced fall risk    Baseline 8/28- able to name 3/5, requires assist for others    Time 3   Period Weeks   Status On-going     PT SHORT TERM GOAL #3   Title Patient to score at least 44 on the BERG in order to demonstrate overall reduced fall risk    Baseline 8/28- 41    Time 3   Period Weeks   Status On-going     PT SHORT TERM GOAL #4   Title Patient to be able to consistently and correctly perform appropriate HEP,  to be updated PRN    Time 3   Period Weeks   Status Achieved           PT Long Term Goals - 06/02/16 1147      PT LONG TERM GOAL #1   Title Patient to score MMT 5/5 in all tested groups in order to demonstrate improved strength for functional mobility based tasks    Baseline 8/28- improving, proximal remains weak   Time 6   Period Weeks   Status On-going     PT LONG TERM GOAL #2   Title Patient to score at least a 50 on BERG balance test in order to demonstrate reduced overall fall risk    Baseline 8/28- 41    Time 6   Period Weeks   Status On-going     PT LONG TERM GOAL #3   Title Patient to be able to safely ascend/descend at least 4 stairs with U railing, good motor control, and minimal unsteadiness in order to improve overall mobility    Time 6   Period Weeks   Status Partially Met     PT LONG TERM GOAL #4   Title Patient to be participatory in regular exercise, at least 20 minutes at a time, at least 4 days per week, in order to maintain functional gains and improve overall health status    Time 6   Period Weeks   Status On-going               Plan - 06/13/16 1205    Clinical Impression Statement Today's session continued with focus on functional strength and balance activity. Pt needing several rest breaks during therex portion, but overall was able to perform all exercises only with report of muscle fatigue. Remainder of session targeted both static and dynamic balance with noted increased difficulty during ambulation when she was instructed to turn 180 degrees. Will continue to address deficits in functional strength and balance.    Rehab Potential Good   PT Frequency 3x / week   PT Duration 3 weeks   PT Treatment/Interventions ADLs/Self Care Home Management;Biofeedback;DME Instruction;Gait training;Stair training;Functional mobility training;Therapeutic activities;Therapeutic exercise;Balance training;Neuromuscular re-education;Patient/family  education;Manual techniques;Passive range of motion;Energy conservation;Taping   PT Next Visit Plan focus on proximal and CKC strength with correct alignment; advanced balance/coordination activities; Nustep. Gait with turns   PT Home Exercise Plan assigned to patient including TANDEM STANCE, SLS, SUPINE BRIDGE, sidelying hip abduction and prone hip extension  No additional exercises given this session.   Consulted and Agree with Plan of Care Patient      Patient will benefit from skilled therapeutic intervention in order to improve the following deficits and impairments:  Abnormal gait, Improper body mechanics, Postural dysfunction, Decreased activity tolerance, Decreased strength, Decreased balance, Decreased safety awareness, Difficulty walking, Impaired flexibility  Visit Diagnosis: Muscle weakness (generalized)  Unsteadiness on feet  Difficulty in walking, not elsewhere classified     Problem List Patient Active Problem List   Diagnosis Date Noted  . Pressure ulcer 05/01/2016  . Hyperglycemia 05/01/2016  . Hyperlipidemia 05/01/2016  . Stroke ([place]) 04/30/2016  . Right sided weakness 04/30/2016  . Essential hypertension 04/30/2016  . Hypothyroidism 04/30/2016   12:23 PM,06/13/16 Elly Modena PT, DPT [facility] [place] [address] [place], [place], [address] Phone: [phone]   Fax:  [phone]  Name: Nancy [name] MRN: [id number] Date of Birth: 1925-04-26

## 2016-06-16 ENCOUNTER — Ambulatory Visit (HOSPITAL_COMMUNITY): Payer: Medicare Other | Admitting: Physical Therapy

## 2016-06-16 DIAGNOSIS — R262 Difficulty in walking, not elsewhere classified: Secondary | ICD-10-CM

## 2016-06-16 DIAGNOSIS — R2681 Unsteadiness on feet: Secondary | ICD-10-CM

## 2016-06-16 DIAGNOSIS — M6281 Muscle weakness (generalized): Secondary | ICD-10-CM

## 2016-06-16 NOTE — Therapy (Signed)
[facility] [address] [place], [place], [address] Phone: [phone]   Fax:  [phone]  Physical Therapy Treatment (Re-Assessment)  Patient Details  Name: Nancy [name] MRN: [id number] Date of Birth: 08-13-1925 Referring Provider: Carylon Perches   Encounter Date: 06/16/2016      PT End of Session - 06/16/16 1158    Visit Number 15   Number of Visits 19   Date for PT Re-Evaluation 07/14/16   Authorization Type [employer] Medicare (G-codes done 14th session)   Authorization Time Period 05/12/16 to 06/23/16; recert done 06/16/16   Authorization - Visit Number 15   Authorization - Number of Visits 25   PT Start Time 1117   PT Stop Time 1200   PT Time Calculation (min) 43 min   Equipment Utilized During Treatment Gait belt   Activity Tolerance Patient tolerated treatment well   Behavior During Therapy [facility] for tasks assessed/performed      Past Medical History:  Diagnosis Date  . DVT (deep venous thrombosis) (HCC)    a. left lower extremity 07/2015.  . Glaucoma   . Hypertension   . Hypothyroidism   . Legally blind     Past Surgical History:  Procedure Laterality Date  . EP IMPLANTABLE DEVICE N/A 05/20/2016   Procedure: Loop Recorder Insertion;  Surgeon: Will Jorja Loa, MD;  [facility]: [facility] INVAS[facility] LAB;  Service: Cardiovascular;  Laterality: N/A;  . NASAL SINUS SURGERY    . SPINE SURGERY    . TEE WITHOUT CARDIOVERSION N/A 05/20/2016   Procedure: TRANSESOPHAGEAL ECHOCARDIOGRAM (TEE) POSSIBLE LOOP RECORDER IMPLANTATION;  Surgeon: Vesta Mixer, MD;  Location: [facility] ENDOSCOPY;  Service: Cardiovascular;  Laterality: N/A;    There were no vitals filed for this visit.      Subjective Assessment - 06/16/16 1120    Subjective Patient arrives reporting that she is feeling pretty good today, she just wasn't feeling 100% yesterday and she wasn't sure why. In general, she continues to be fairly independent with all tasks and is able to do  what she needs to do in general. No close calls with falling recently.    Patient Stated Goals to get stronger, walk better, get balance better    Currently in Pain? No/denies            [facility] PT Assessment - 06/16/16 0001      Assessment   Medical Diagnosis CVA   Referring Provider Carylon Perches    Onset Date/Surgical Date 04/30/16   Next MD Visit Dr. Ouida Sills August 29th      Balance Screen   Has the patient fallen in the past 6 months No   Has the patient had a decrease in activity level because of a fear of falling?  No   Is the patient reluctant to leave their home because of a fear of falling?  No     Prior Function   Level of Independence Independent;Independent with basic ADLs;Independent with gait;Independent with transfers   Vocation Retired   Leisure none      Strength   Right Hip Flexion 4-/5   Right Hip Extension 2/5   Right Hip ABduction 3+/5   Left Hip Flexion 4-/5   Left Hip Extension 2+/5   Left Hip ABduction 3+/5   Right Knee Flexion 4/5   Right Knee Extension 4/5   Left Knee Flexion 4/5   Left Knee Extension 4/5   Right Ankle Dorsiflexion 5/5   Left Ankle Dorsiflexion 5/5  6 minute walk test results    Aerobic Endurance Distance Walked 602   Endurance additional comments , walker; 463ft with no device      Berg Balance Test   Sit to Stand Able to stand without using hands and stabilize independently   Standing Unsupported Able to stand safely 2 minutes   Sitting with Back Unsupported but Feet Supported on Floor or Stool Able to sit safely and securely 2 minutes   Stand to Sit Sits safely with minimal use of hands   Transfers Able to transfer safely, minor use of hands   Standing Unsupported with Eyes Closed Able to stand 10 seconds safely   Standing Ubsupported with Feet Together Able to place feet together independently and stand 1 minute safely   From Standing, Reach Forward with Outstretched Arm Can reach confidently >25 cm (10")   From  Standing Position, Pick up Object from Floor Able to pick up shoe safely and easily   From Standing Position, Turn to Look Behind Over each Shoulder Looks behind one side only/other side shows less weight shift   Turn 360 Degrees Able to turn 360 degrees safely but slowly   Standing Unsupported, Alternately Place Feet on Step/Stool Able to complete >2 steps/needs minimal assist   Standing Unsupported, One Foot in Front Needs help to step but can hold 15 seconds   Standing on One Leg Able to lift leg independently and hold equal to or more than 3 seconds   Total Score 45                             PT Education - 06/16/16 1216    Education provided Yes   Education Details progress wtih skilled PT services, POC moving forward    Person(s) Educated Patient   Methods Explanation   Comprehension Verbalized understanding          PT Short Term Goals - 06/16/16 1151      PT SHORT TERM GOAL #1   Title Patient to be able to ambulate 783ft during with rollator in order to demonstrate improved mobilty and functional activity tolerance    Baseline 9/11- 613ft with rollator    Time 3   Period Weeks   Status On-going     PT SHORT TERM GOAL #2   Title Patient to be able to identify 5/5 safety items for use at home and community in order to demonstrate improved safety awareness/reduced fall risk    Baseline 9/11- able to name 3/5, requires assist for others    Time 3   Period Weeks   Status On-going     PT SHORT TERM GOAL #3   Title Patient to score at least 44 on the BERG in order to demonstrate overall reduced fall risk    Baseline 9/11- 45   Time 3   Period Weeks   Status Achieved     PT SHORT TERM GOAL #4   Title Patient to be able to consistently and correctly perform appropriate HEP, to be updated PRN    Baseline 9/11- reports compliance except for busy days    Time 3   Period Weeks   Status Achieved           PT Long Term Goals - 06/16/16 1154       PT LONG TERM GOAL #1   Title Patient to score MMT 5/5 in all tested groups in order to demonstrate improved  strength for functional mobility based tasks    Baseline 9/11- ongoing    Time 6   Period Weeks   Status On-going     PT LONG TERM GOAL #2   Title Patient to score at least a 50 on BERG balance test in order to demonstrate reduced overall fall risk    Baseline 9/11- 45   Time 6   Period Weeks   Status On-going     PT LONG TERM GOAL #3   Title Patient to be able to safely ascend/descend at least 4 stairs with U railing, good motor control, and minimal unsteadiness in order to improve overall mobility    Baseline 9/11- descent impaired by weakness    Time 6   Period Weeks   Status Achieved     PT LONG TERM GOAL #4   Title Patient to be participatory in regular exercise, at least 20 minutes at a time, at least 4 days per week, in order to maintain functional gains and improve overall health status    Baseline 9/11- reports her church is starting an exercise class she is planning on going to    Time 6   Period Weeks   Status On-going               Plan - 06/16/16 1217    Clinical Impression Statement Re-assessment performed early due to patient doing very well with skilled PT services/having concerns about her transportation to get to clinic. Patient continues to progress well, with noted improvements in balance, gait mechanics and distance, however she does continue to demonstrate reduced functional activity tolerance, general unsteadiness, and reduced functional strength at this time. At this point recommend extension of skilled PT services with frequency dropped to 1x/week to accommodate patient's concerns with transportation; recommend ongoing focus on strength, balance, and facilitation of independent exercise program moving forward before DC.    Rehab Potential Good   PT Frequency 1x / week   PT Duration 4 weeks   PT Treatment/Interventions ADLs/Self Care Home  Management;Biofeedback;DME Instruction;Gait training;Stair training;Functional mobility training;Therapeutic activities;Therapeutic exercise;Balance training;Neuromuscular re-education;Patient/family education;Manual techniques;Passive range of motion;Energy conservation;Taping   PT Next Visit Plan Update HEP (functional strength focus). Focus on balance and strength, design independent exercise program patient can do at home independent/[facility]ote senior center/exercise groups. Likely DC in 4 sessions.    PT Home Exercise Plan assigned to patient including TANDEM STANCE, SLS, SUPINE BRIDGE, sidelying hip abduction and prone hip extension   No additional exercises given this session.   Consulted and Agree with Plan of Care Patient      Patient will benefit from skilled therapeutic intervention in order to improve the following deficits and impairments:  Abnormal gait, Improper body mechanics, Postural dysfunction, Decreased activity tolerance, Decreased strength, Decreased balance, Decreased safety awareness, Difficulty walking, Impaired flexibility  Visit Diagnosis: Muscle weakness (generalized) - Plan: PT plan of care cert/re-cert  Unsteadiness on feet - Plan: PT plan of care cert/re-cert  Difficulty in walking, not elsewhere classified - Plan: PT plan of care cert/re-cert       G-Codes - 06/16/16 1218    Functional Assessment Tool Used Based on skilled clinical assessment of strength, gait, balance    Functional Limitation Mobility: Walking and moving around   Mobility: Walking and Moving Around Current Status ([id number]) At least 20 percent but less than 40 percent impaired, limited or restricted   Mobility: Walking and Moving Around Goal Status ([id number]) At least 1 percent but less than 20 percent  impaired, limited or restricted      Problem List Patient Active Problem List   Diagnosis Date Noted  . Pressure ulcer 05/01/2016  . Hyperglycemia 05/01/2016  . Hyperlipidemia 05/01/2016  .  Stroke (HCC) 04/30/2016  . Right sided weakness 04/30/2016  . Essential hypertension 04/30/2016  . Hypothyroidism 04/30/2016    Nedra Hai PT, DPT [phone]  [facility] [address] [place], [place], [address] Phone: [phone]   Fax:  [phone]  Name: VANDORA JASKULSKI MRN: [id number] Date of Birth: 22-Dec-1924

## 2016-06-18 ENCOUNTER — Ambulatory Visit (HOSPITAL_COMMUNITY): Payer: Medicare Other

## 2016-06-18 DIAGNOSIS — R262 Difficulty in walking, not elsewhere classified: Secondary | ICD-10-CM

## 2016-06-18 DIAGNOSIS — M6281 Muscle weakness (generalized): Secondary | ICD-10-CM

## 2016-06-18 DIAGNOSIS — R2681 Unsteadiness on feet: Secondary | ICD-10-CM

## 2016-06-18 NOTE — Patient Instructions (Signed)
Band Walk: Side Stepping    Tie band around ankle, just above knees. Step in front of kitchen counter one directions, then step back to start. Note: Small towel between band and skin eases rubbing.  http://plyo.exer.us/76   Copyright  VHI. All rights reserved.   Toe / Heel Raise (Standing)    Standing with support, raise heels, then rock back on heels and raise toes. Repeat 15 times.  Copyright  VHI. All rights reserved.   Functional Quadriceps: Sit to Stand    Sit on edge of chair, feet flat on floor. Stand upright, extending knees fully. Repeat 10 times per set. Do 1-2 sets per session.  http://orth.exer.us/735   Copyright  VHI. All rights reserved.

## 2016-06-18 NOTE — Therapy (Signed)
[facility] [address] [place], [place], [address] Phone: [phone]   Fax:  [phone]  Physical Therapy Treatment  Patient Details  Name: Nancy [name] MRN: [id number] Date of Birth: [birth year] Referring Provider: Carylon Perches   Encounter Date: 06/18/2016      PT End of Session - 06/18/16 1158    Visit Number 16   Number of Visits 19   Date for PT Re-Evaluation 07/14/16   Authorization Type [employer] Medicare (G-codes done 14th session)   Authorization Time Period 05/12/16 to 06/23/16; recert done 06/16/16   Authorization - Visit Number 16   Authorization - Number of Visits 25   PT Start Time 1118   PT Stop Time 1208   PT Time Calculation (min) 50 min   Equipment Utilized During Treatment Gait belt   Activity Tolerance Patient tolerated treatment well   Behavior During Therapy [facility] for tasks assessed/performed      Past Medical History:  Diagnosis Date  . DVT (deep venous thrombosis) (HCC)    a. left lower extremity 07/2015.  . Glaucoma   . Hypertension   . Hypothyroidism   . Legally blind     Past Surgical History:  Procedure Laterality Date  . EP IMPLANTABLE DEVICE N/A 05/20/2016   Procedure: Loop Recorder Insertion;  Surgeon: Will Jorja Loa, MD;  Location: [facility] INVASIVE CV LAB;  Service: Cardiovascular;  Laterality: N/A;  . NASAL SINUS SURGERY    . SPINE SURGERY    . TEE WITHOUT CARDIOVERSION N/A 05/20/2016   Procedure: TRANSESOPHAGEAL ECHOCARDIOGRAM (TEE) POSSIBLE LOOP RECORDER IMPLANTATION;  Surgeon: Vesta Mixer, MD;  Location: [facility] ENDOSCOPY;  Service: Cardiovascular;  Laterality: N/A;    There were no vitals filed for this visit.      Subjective Assessment - 06/18/16 1125    Subjective Pt stated she is feeling good today, feels most difficulty currently in balance   Pertinent History HTN, CVA, hypothyroidism, hx of DVT, glaucoma, legally blind, multiple surgeries for a ruptured disc and a bulging disc    Patient  Stated Goals to get stronger, walk better, get balance better    Currently in Pain? No/denies                         [facility] Adult PT Treatment/Exercise - 06/18/16 0001      Knee/Hip Exercises: Standing   Heel Raises Both;1 set;20 reps   Heel Raises Limitations toe raises   Functional Squat 2 sets;15 reps   Functional Squat Limitations verbal cues to decrease knee valgus    Gait Training no AD, x240ft with commands stop/go/turn, x200 ft with commands fast/slow gait, CGA and no LOB noted with turns along with report of dizziness              Balance Exercises - 06/18/16 1247      Balance Exercises: Standing   SLS Eyes open;3 reps;20 secs  Lt 6"   Rockerboard Anterior/posterior;Lateral  2 min each no HHA   Tandem Gait Forward;4 reps   Sidestepping 2 reps;Theraband  RTB, addition to HEP   Heel Raises Limitations 15   Toe Raise Limitations 15             PT Short Term Goals - 06/16/16 1151      PT SHORT TERM GOAL #1   Title Patient to be able to ambulate 750ft during with rollator in order to demonstrate improved mobilty and functional activity tolerance  Baseline 9/11- 66302ft with rollator    Time 3   Period Weeks   Status On-going     PT SHORT TERM GOAL #2   Title Patient to be able to identify 5/5 safety items for use at home and community in order to demonstrate improved safety awareness/reduced fall risk    Baseline 9/11- able to name 3/5, requires assist for others    Time 3   Period Weeks   Status On-going     PT SHORT TERM GOAL #3   Title Patient to score at least 44 on the BERG in order to demonstrate overall reduced fall risk    Baseline 9/11- 45   Time 3   Period Weeks   Status Achieved     PT SHORT TERM GOAL #4   Title Patient to be able to consistently and correctly perform appropriate HEP, to be updated PRN    Baseline 9/11- reports compliance except for busy days    Time 3   Period Weeks   Status Achieved            PT Long Term Goals - 06/16/16 1154      PT LONG TERM GOAL #1   Title Patient to score MMT 5/5 in all tested groups in order to demonstrate improved strength for functional mobility based tasks    Baseline 9/11- ongoing    Time 6   Period Weeks   Status On-going     PT LONG TERM GOAL #2   Title Patient to score at least a 50 on BERG balance test in order to demonstrate reduced overall fall risk    Baseline 9/11- 45   Time 6   Period Weeks   Status On-going     PT LONG TERM GOAL #3   Title Patient to be able to safely ascend/descend at least 4 stairs with U railing, good motor control, and minimal unsteadiness in order to improve overall mobility    Baseline 9/11- descent impaired by weakness    Time 6   Period Weeks   Status Achieved     PT LONG TERM GOAL #4   Title Patient to be participatory in regular exercise, at least 20 minutes at a time, at least 4 days per week, in order to maintain functional gains and improve overall health status    Baseline 9/11- reports her church is starting an exercise class she is planning on going to    Time 6   Period Weeks   Status On-going               Plan - 06/18/16 1207    Clinical Impression Statement Session focus on improving balance training and discussing frequency with HEP and plans for post DC.  Min guard required with all balance activities for safety with ability to indepdendently regain LOB episodes.  Pt reports she plans on attending group classes 1x a week with church and reports compliance with HEP daily.  Pt given advanced HEP to address functional strengthening and balance training.  No reports of pain through session, was limited by fatigue with activities.     Rehab Potential Good   PT Frequency 1x / week   PT Duration 4 weeks   PT Treatment/Interventions ADLs/Self Care Home Management;Biofeedback;DME Instruction;Gait training;Stair training;Functional mobility training;Therapeutic activities;Therapeutic  exercise;Balance training;Neuromuscular re-education;Patient/family education;Manual techniques;Passive range of motion;Energy conservation;Taping   PT Next Visit Plan Focus on balance and strength, design independent exercise program patient can do at home independent/promote senior  center/exercise groups. Likely DC in 3 sessions.    PT Home Exercise Plan assigned to patient including TANDEM STANCE, SLS, SUPINE BRIDGE, sidelying hip abduction and prone hip extension  09/13/2017addition of heel raises/toe raises; sidestep with RTB and sit to stand      Patient will benefit from skilled therapeutic intervention in order to improve the following deficits and impairments:  Abnormal gait, Improper body mechanics, Postural dysfunction, Decreased activity tolerance, Decreased strength, Decreased balance, Decreased safety awareness, Difficulty walking, Impaired flexibility  Visit Diagnosis: Muscle weakness (generalized)  Unsteadiness on feet  Difficulty in walking, not elsewhere classified     Problem List Patient Active Problem List   Diagnosis Date Noted  . Pressure ulcer 05/01/2016  . Hyperglycemia 05/01/2016  . Hyperlipidemia 05/01/2016  . Stroke (HCC) 04/30/2016  . Right sided weakness 04/30/2016  . Essential hypertension 04/30/2016  . Hypothyroidism 04/30/2016   Becky Sax, LPTA; CBIS [phone]  Juel Burrow 06/18/2016, 1:00 PM  [facility] [address] [place], [place], [address] Phone: [phone]   Fax:  [phone]  Name: Nancy [name] MRN: [id number] Date of Birth: December 19, 1924

## 2016-06-19 ENCOUNTER — Ambulatory Visit (INDEPENDENT_AMBULATORY_CARE_PROVIDER_SITE_OTHER): Payer: Medicare Other | Admitting: *Deleted

## 2016-06-19 DIAGNOSIS — Z95818 Presence of other cardiac implants and grafts: Secondary | ICD-10-CM | POA: Diagnosis not present

## 2016-06-20 ENCOUNTER — Ambulatory Visit (HOSPITAL_COMMUNITY): Payer: Medicare Other

## 2016-06-20 DIAGNOSIS — M6281 Muscle weakness (generalized): Secondary | ICD-10-CM | POA: Diagnosis not present

## 2016-06-20 DIAGNOSIS — R262 Difficulty in walking, not elsewhere classified: Secondary | ICD-10-CM

## 2016-06-20 DIAGNOSIS — R2681 Unsteadiness on feet: Secondary | ICD-10-CM

## 2016-06-20 NOTE — Therapy (Signed)
[facility] [address] [place], [place], [address] Phone: [phone]   Fax:  [phone]  Physical Therapy Treatment  Patient Details  Name: Nancy [name] MRN: [id number] Date of Birth: [birth year] Referring Provider: Carylon Perches   Encounter Date: 06/20/2016      PT End of Session - 06/20/16 1136    Visit Number 17   Number of Visits 19   Date for PT Re-Evaluation 07/14/16   Authorization Type [employer] Medicare (G-codes done 14th session)   Authorization Time Period 05/12/16 to 06/23/16; recert done 06/16/16   Authorization - Visit Number 17   Authorization - Number of Visits 25   PT Start Time 1120   PT Stop Time 1158   PT Time Calculation (min) 38 min   Equipment Utilized During Treatment Gait belt   Activity Tolerance Patient tolerated treatment well;No increased pain   Behavior During Therapy WFL for tasks assessed/performed      Past Medical History:  Diagnosis Date  . DVT (deep venous thrombosis) (HCC)    a. left lower extremity 07/2015.  . Glaucoma   . Hypertension   . Hypothyroidism   . Legally blind     Past Surgical History:  Procedure Laterality Date  . EP IMPLANTABLE DEVICE N/A 05/20/2016   Procedure: Loop Recorder Insertion;  Surgeon: Will Jorja Loa, MD;  Location: [facility] IN[facility];  Service: Cardiovascular;  Laterality: N/A;  . NASAL SINUS SURGERY    . SPINE SURGERY    . TEE WITHOUT CARDIOVERSION N/A 05/20/2016   Procedure: TRANSESOPHAGEAL ECHOCARDIOGRAM (TEE) POSSIBLE LOOP RECORDER IMPLANTATION;  Surgeon: Vesta Mixer, MD;  Location: [facility] ENDOSCOPY;  Service: Cardiovascular;  Laterality: N/A;    There were no vitals filed for this visit.      Subjective Assessment - 06/20/16 1130    Subjective Pt stated she is feeling good today, feels her balance is improving.  Pt wishes to DC to HEP next week   Pertinent History HTN, CVA, hypothyroidism, hx of DVT, glaucoma, legally blind, multiple surgeries for a  ruptured disc and a bulging disc    Patient Stated Goals to get stronger, walk better, get balance better    Currently in Pain? No/denies                         [facility] Adult PT Treatment/Exercise - 06/20/16 0001      Knee/Hip Exercises: Standing   Heel Raises Both;10 reps   Heel Raises Limitations proper lifting orange ball from 12in step with heel raise over head   Functional Squat 10 reps   Functional Squat Limitations proper lifting with orange ball from 12in step   Stairs 2RT reciprocal pattern 1 HR   SLS Lt 19", Rt 24"   Gait Training no AD x 452 ft with commands stop/go/turns; min guard for safety with staggered gait mechanics especially at corners             Balance Exercises - 06/20/16 1142      Balance Exercises: Standing   SLS Eyes open;3 reps   Rockerboard Anterior/posterior;Lateral  No HHA   Tandem Gait Forward;3 reps   Retro Gait 2 reps   Toe Raise Limitations 15             PT Short Term Goals - 06/16/16 1151      PT SHORT TERM GOAL #1   Title Patient to be able to ambulate 745ft during with  rollator in order to demonstrate improved mobilty and functional activity tolerance    Baseline 9/11- 64602ft with rollator    Time 3   Period Weeks   Status On-going     PT SHORT TERM GOAL #2   Title Patient to be able to identify 5/5 safety items for use at home and community in order to demonstrate improved safety awareness/reduced fall risk    Baseline 9/11- able to name 3/5, requires assist for others    Time 3   Period Weeks   Status On-going     PT SHORT TERM GOAL #3   Title Patient to score at least 44 on the BERG in order to demonstrate overall reduced fall risk    Baseline 9/11- 45   Time 3   Period Weeks   Status Achieved     PT SHORT TERM GOAL #4   Title Patient to be able to consistently and correctly perform appropriate HEP, to be updated PRN    Baseline 9/11- reports compliance except for busy days    Time 3    Period Weeks   Status Achieved           PT Long Term Goals - 06/16/16 1154      PT LONG TERM GOAL #1   Title Patient to score MMT 5/5 in all tested groups in order to demonstrate improved strength for functional mobility based tasks    Baseline 9/11- ongoing    Time 6   Period Weeks   Status On-going     PT LONG TERM GOAL #2   Title Patient to score at least a 50 on BERG balance test in order to demonstrate reduced overall fall risk    Baseline 9/11- 45   Time 6   Period Weeks   Status On-going     PT LONG TERM GOAL #3   Title Patient to be able to safely ascend/descend at least 4 stairs with U railing, good motor control, and minimal unsteadiness in order to improve overall mobility    Baseline 9/11- descent impaired by weakness    Time 6   Period Weeks   Status Achieved     PT LONG TERM GOAL #4   Title Patient to be participatory in regular exercise, at least 20 minutes at a time, at least 4 days per week, in order to maintain functional gains and improve overall health status    Baseline 9/11- reports her church is starting an exercise class she is planning on going to    Time 6   Period Weeks   Status On-going               Plan - 06/20/16 1231    Clinical Impression Statement Continued session focus on improving balance and strengthening.  Added proper lifting wiht heel raises for functional strengthening as well as stairs reciprocal pattern with gait training this session.  Pt able to complete all exericses with minimal cueing required, no reports of pain through session was limited by fatigue.  Reviewed compliance with HEP.   Rehab Potential Good   PT Frequency 1x / week   PT Duration 4 weeks   PT Treatment/Interventions ADLs/Self Care Home Management;Biofeedback;DME Instruction;Gait training;Stair training;Functional mobility training;Therapeutic activities;Therapeutic exercise;Balance training;Neuromuscular re-education;Patient/family education;Manual  techniques;Passive range of motion;Energy conservation;Taping   PT Next Visit Plan Reassess next sessoin for likely DC.   PT Home Exercise Plan assigned to patient including TANDEM STANCE, SLS, SUPINE BRIDGE, sidelying hip abduction and prone hip  extension  09/13/2017addition of heel raises/toe raises; sidestep with RTB and sit to stand      Patient will benefit from skilled therapeutic intervention in order to improve the following deficits and impairments:  Abnormal gait, Improper body mechanics, Postural dysfunction, Decreased activity tolerance, Decreased strength, Decreased balance, Decreased safety awareness, Difficulty walking, Impaired flexibility  Visit Diagnosis: Muscle weakness (generalized)  Unsteadiness on feet  Difficulty in walking, not elsewhere classified     Problem List Patient Active Problem List   Diagnosis Date Noted  . Pressure ulcer 05/01/2016  . Hyperglycemia 05/01/2016  . Hyperlipidemia 05/01/2016  . Stroke (HCC) 04/30/2016  . Right sided weakness 04/30/2016  . Essential hypertension 04/30/2016  . Hypothyroidism 04/30/2016   Becky Sax, LPTA; CBIS [phone]  Juel Burrow 06/20/2016, 12:45 PM  [facility] [address] [place], [place], [address] Phone: [phone]   Fax:  [phone]  Name: SYNIA DOUGLASS MRN: [id number] Date of Birth: May 09, 1925

## 2016-06-20 NOTE — Progress Notes (Signed)
Carelink Summary Report / Loop Recorder 

## 2016-06-25 ENCOUNTER — Ambulatory Visit (HOSPITAL_COMMUNITY): Payer: Medicare Other | Admitting: Physical Therapy

## 2016-06-25 DIAGNOSIS — R262 Difficulty in walking, not elsewhere classified: Secondary | ICD-10-CM

## 2016-06-25 DIAGNOSIS — M6281 Muscle weakness (generalized): Secondary | ICD-10-CM | POA: Diagnosis not present

## 2016-06-25 DIAGNOSIS — R2681 Unsteadiness on feet: Secondary | ICD-10-CM

## 2016-06-25 NOTE — Therapy (Signed)
[place], [place], [address] Phone: [phone]   Fax:  [phone]  Physical Therapy Treatment (Discharge)  Patient Details  Name: Nancy [name] MRN: [id number] Date of Birth: 04/03/1925 Referring Provider: Asencion Noble   Encounter Date: 06/25/2016      PT End of Session - 06/25/16 1201    Visit Number 18   Number of Visits [place] Medicare (G-codes done 18th session)   Authorization Time Period 06/12/93 to 05/06/64; recert done 5/37/48   Authorization - Visit Number 18   Authorization - Number of Visits 25   PT Start Time 2707   PT Stop Time 1146  DC today, at satsifactory level of function, no frutehr skilled services needed    PT Time Calculation (min) 30 min   Equipment Utilized During Treatment Gait belt   Activity Tolerance Patient tolerated treatment well   Behavior During Therapy [facility] for tasks assessed/performed      Past Medical History:  Diagnosis Date  . DVT (deep venous thrombosis) ([place])    a. left lower extremity 07/2015.  . Glaucoma   . Hypertension   . Hypothyroidism   . Legally blind     Past Surgical History:  Procedure Laterality Date  . EP IMPLANTABLE DEVICE N/A 05/20/2016   Procedure: Loop Recorder Insertion;  Surgeon: Will Meredith Leeds, MD;  Location: [place] CV LAB;  Service: Cardiovascular;  Laterality: N/A;  . NASAL SINUS SURGERY    . SPINE SURGERY    . TEE WITHOUT CARDIOVERSION N/A 05/20/2016   Procedure: TRANSESOPHAGEAL ECHOCARDIOGRAM (TEE) POSSIBLE LOOP RECORDER IMPLANTATION;  Surgeon: Thayer Headings, MD;  Location: [place];  Service: Cardiovascular;  Laterality: N/A;    There were no vitals filed for this visit.      Subjective Assessment - 06/25/16 1118    Subjective Patient arrives today stating she wants to make today her last day; she doesn't think she is really going to improve anymore at this point. She thinks that balance is still  fairly tough for her but attributes this to her eyesight. No falls or close calls recently. She only rates herself as being 50/100 on a subjective scale, mostly due to her balance.    Pertinent History HTN, CVA, hypothyroidism, hx of DVT, glaucoma, legally blind, multiple surgeries for a ruptured disc and a bulging disc    How long can you sit comfortably? 9/20- unlimited    How long can you stand comfortably? 9/20- 15 minutes without holding onto anything    How long can you walk comfortably? 9/20- not much more than 5 minutes (but has completed 45 minute PT sessions with no issues)   Patient Stated Goals to get stronger, walk better, get balance better    Currently in Pain? No/denies            [facility] PT Assessment - 06/25/16 0001      Strength   Right Hip Flexion 4+/5   Right Hip Extension 2/5   Right Hip ABduction 3+/5   Left Hip Flexion 4/5   Left Hip Extension 2/5   Left Hip ABduction 3/5   Right Knee Flexion 4-/5   Right Knee Extension 4/5   Left Knee Flexion 4/5   Left Knee Extension 4-/5   Right Ankle Dorsiflexion 4+/5   Left Ankle Dorsiflexion 5/5     6 minute walk test results    Aerobic Endurance Distance Walked 521   Endurance additional  comments 3MWT with walker      Berg Balance Test   Sit to Stand Able to stand without using hands and stabilize independently   Standing Unsupported Able to stand safely 2 minutes   Sitting with Back Unsupported but Feet Supported on Floor or Stool Able to sit safely and securely 2 minutes   Stand to Sit Sits safely with minimal use of hands   Transfers Able to transfer safely, minor use of hands   Standing Unsupported with Eyes Closed Able to stand 10 seconds safely   Standing Ubsupported with Feet Together Able to place feet together independently and stand 1 minute safely   From Standing, Reach Forward with Outstretched Arm Can reach confidently >25 cm (10")   From Standing Position, Pick up Object from Floor Able to pick up  shoe safely and easily   From Standing Position, Turn to Look Behind Over each Shoulder Looks behind one side only/other side shows less weight shift   Turn 360 Degrees Able to turn 360 degrees safely but slowly   Standing Unsupported, Alternately Place Feet on Step/Stool Able to complete >2 steps/needs minimal assist   Standing Unsupported, One Foot in Front Needs help to step but can hold 15 seconds   Standing on One Leg Able to lift leg independently and hold equal to or more than 3 seconds   Total Score 45                             PT Education - 06/25/16 1201    Education provided Yes   Education Details DC today, continue with current HEP, recommend senior center and church exercise group    Person(s) Educated Patient   Methods Explanation   Comprehension Verbalized understanding          PT Short Term Goals - 06/16/16 1151      PT SHORT TERM GOAL #1   Title Patient to be able to ambulate 7102f during 3MWT with rollator in order to demonstrate improved mobilty and functional activity tolerance    Baseline 9/11- 6015fwith rollator    Time 3   Period Weeks   Status On-going     PT SHORT TERM GOAL #2   Title Patient to be able to identify 5/5 safety items for use at home and community in order to demonstrate improved safety awareness/reduced fall risk    Baseline 9/11- able to name 3/5, requires assist for others    Time 3   Period Weeks   Status On-going     PT SHORT TERM GOAL #3   Title Patient to score at least 44 on the BERG in order to demonstrate overall reduced fall risk    Baseline 9/11- 45   Time 3   Period Weeks   Status Achieved     PT SHORT TERM GOAL #4   Title Patient to be able to consistently and correctly perform appropriate HEP, to be updated PRN    Baseline 9/11- reports compliance except for busy days    Time 3   Period Weeks   Status Achieved           PT Long Term Goals - 06/16/16 1154      PT LONG TERM GOAL #1    Title Patient to score MMT 5/5 in all tested groups in order to demonstrate improved strength for functional mobility based tasks    Baseline 9/11- ongoing  Time 6   Period Weeks   Status On-going     PT LONG TERM GOAL #2   Title Patient to score at least a 50 on BERG balance test in order to demonstrate reduced overall fall risk    Baseline 9/11- 45   Time 6   Period Weeks   Status On-going     PT LONG TERM GOAL #3   Title Patient to be able to safely ascend/descend at least 4 stairs with U railing, good motor control, and minimal unsteadiness in order to improve overall mobility    Baseline 9/11- descent impaired by weakness    Time 6   Period Weeks   Status Achieved     PT LONG TERM GOAL #4   Title Patient to be participatory in regular exercise, at least 20 minutes at a time, at least 4 days per week, in order to maintain functional gains and improve overall health status    Baseline 9/11- reports her church is starting an exercise class she is planning on going to    Time 6   Period Weeks   Status On-going               Plan - 22-Jul-2016 1202    Clinical Impression Statement Re-assessment performed today per patient request as she would like to be discharged. Upon examination, patient does not reveal any major significant objective changes as compared to her last assessment, and does appear appropriate for DC at this time. She is satisfied with her current HEP, and was given education on how to progress exercises if needed; otherwise recommended regular attendance to church exercise groups and senior center exercise group. DC today due to patient request/lack of progress.    Rehab Potential Good   PT Next Visit Plan DC today    Consulted and Agree with Plan of Care Patient      Patient will benefit from skilled therapeutic intervention in order to improve the following deficits and impairments:  Abnormal gait, Improper body mechanics, Postural dysfunction,  Decreased activity tolerance, Decreased strength, Decreased balance, Decreased safety awareness, Difficulty walking, Impaired flexibility  Visit Diagnosis: Muscle weakness (generalized)  Unsteadiness on feet  Difficulty in walking, not elsewhere classified       G-Codes - 2016/07/22 1205    Functional Assessment Tool Used Based on skilled clinical assessment of strength, gait, balance    Functional Limitation Mobility: Walking and moving around   Mobility: Walking and Moving Around Goal Status [phone]) At least 1 percent but less than 20 percent impaired, limited or restricted   Mobility: Walking and Moving Around Discharge Status [phone]) At least 20 percent but less than 40 percent impaired, limited or restricted      Problem List Patient Active Problem List   Diagnosis Date Noted  . Pressure ulcer 05/01/2016  . Hyperglycemia 05/01/2016  . Hyperlipidemia 05/01/2016  . Stroke ([place]) 04/30/2016  . Right sided weakness 04/30/2016  . Essential hypertension 04/30/2016  . Hypothyroidism 04/30/2016   PHYSICAL THERAPY DISCHARGE SUMMARY  Visits from Start of Care: 18  Current functional level related to goals / functional outcomes: Re-assessment performed today per patient request as she would like to be discharged. Upon examination, patient does not reveal any major significant objective changes as compared to her last assessment, and does appear appropriate for DC at this time. She is satisfied with her current HEP, and was given education on how to progress exercises if needed; otherwise recommended regular attendance to church exercise  groups and senior center exercise group. DC today due to patient request/lack of progress.      Remaining deficits: Unsteadiness, functional weakness, reduced functional activity tolerance, postural deficits    Education / Equipment: DC today, continue with current HEP, recommend senior center and church exercise group  Plan: Patient agrees to  discharge.  Patient goals were partially met. Patient is being discharged due to being pleased with the current functional level.  ?????     Deniece Ree PT, DPT [facility] [address] [place], [place], [address] Phone: [phone]   Fax:  [phone]  Name: VALKYRIE GUARDIOLA MRN: [id number] Date of Birth: [birth year]

## 2016-06-26 ENCOUNTER — Ambulatory Visit (INDEPENDENT_AMBULATORY_CARE_PROVIDER_SITE_OTHER): Payer: Medicare Other | Admitting: Podiatry

## 2016-06-26 ENCOUNTER — Encounter: Payer: Self-pay | Admitting: Podiatry

## 2016-06-26 VITALS — BP 151/79 | HR 79 | Resp 14

## 2016-06-26 DIAGNOSIS — M79676 Pain in unspecified toe(s): Secondary | ICD-10-CM

## 2016-06-26 DIAGNOSIS — B351 Tinea unguium: Secondary | ICD-10-CM | POA: Diagnosis not present

## 2016-06-26 DIAGNOSIS — L600 Ingrowing nail: Secondary | ICD-10-CM

## 2016-06-26 DIAGNOSIS — M79609 Pain in unspecified limb: Principal | ICD-10-CM

## 2016-06-26 NOTE — Progress Notes (Signed)
Patient ID: Nancy [name], female   DOB: 10/23/1924, 82 y.o.   MRN: [id number] Complaint:  Visit Type: Patient returns to my office for continued preventative foot care services. Complaint: Patient states" my nails have grown long and thick and become painful to walk and wear shoes" . The patient presents for preventative foot care services. No changes to ROS.  Past history of nail surgery medial border right foot. She says she has CVA in July but has recovered well.  Podiatric Exam: Vascular: dorsalis pedis and posterior tibial pulses are palpable bilateral. Capillary return is immediate. Temperature gradient is WNL. Skin turgor WNL  Sensorium: Normal Semmes Weinstein monofilament test. Normal tactile sensation bilaterally. Nail Exam: Pt has thick disfigured discolored nails with subungual debris noted bilateral entire nail hallux  Ulcer Exam: There is no evidence of ulcer or pre-ulcerative changes or infection. Orthopedic Exam: Muscle tone and strength are WNL. No limitations in general ROM. No crepitus or effusions noted. Foot type and digits show no abnormalities. Bony prominences are unremarkable. Skin: No Porokeratosis. No infection or ulcers  Diagnosis:  Onychomycosis, , Pain in right toe, pain in left toes  Treatment & Plan Procedures and Treatment: Consent by patient was obtained for treatment procedures. The patient understood the discussion of treatment and procedures well. All questions were answered thoroughly reviewed. Debridement of mycotic and hypertrophic toenails, 1 through 5 bilateral and clearing of subungual debris. No ulceration, no infection noted.  Return Visit-Office Procedure: Patient instructed to return to the office for a follow up visit 3 months for continued evaluation and treatment....   Danashia Landers DPM 

## 2016-07-01 ENCOUNTER — Ambulatory Visit (HOSPITAL_COMMUNITY): Payer: Medicare Other | Admitting: Physical Therapy

## 2016-07-09 ENCOUNTER — Ambulatory Visit (HOSPITAL_COMMUNITY): Payer: Medicare Other | Admitting: Physical Therapy

## 2016-07-12 LAB — CUP PACEART REMOTE DEVICE CHECK: MDC IDC SESS DTM: 20170914153532

## 2016-07-12 NOTE — Progress Notes (Signed)
Carelink summary report received. Battery status OK. Normal device function. No new symptom episodes, tachy episodes, brady, or pause episodes. No new AF episodes. Monthly summary reports and ROV/PRN 

## 2016-07-16 ENCOUNTER — Ambulatory Visit (HOSPITAL_COMMUNITY): Payer: Medicare Other | Admitting: Physical Therapy

## 2016-07-21 ENCOUNTER — Ambulatory Visit (INDEPENDENT_AMBULATORY_CARE_PROVIDER_SITE_OTHER): Payer: Medicare Other | Admitting: *Deleted

## 2016-07-21 DIAGNOSIS — Z95818 Presence of other cardiac implants and grafts: Secondary | ICD-10-CM

## 2016-07-21 NOTE — Progress Notes (Signed)
Carelink Summary Report / Loop Recorder 

## 2016-08-18 ENCOUNTER — Ambulatory Visit (INDEPENDENT_AMBULATORY_CARE_PROVIDER_SITE_OTHER): Payer: Medicare Other | Admitting: *Deleted

## 2016-08-18 DIAGNOSIS — Z95818 Presence of other cardiac implants and grafts: Secondary | ICD-10-CM

## 2016-08-18 NOTE — Progress Notes (Signed)
Carelink Summary Report / Loop Recorder 

## 2016-08-23 LAB — CUP PACEART REMOTE DEVICE CHECK
Implantable Pulse Generator Implant Date: 20170815
MDC IDC SESS DTM: 20171014173527

## 2016-08-23 NOTE — Progress Notes (Signed)
Carelink summary report received. Battery status OK. Normal device function. No new symptom episodes, tachy episodes, brady, or pause episodes. No new AF episodes. Monthly summary reports and ROV/PRN 

## 2016-09-17 ENCOUNTER — Ambulatory Visit (INDEPENDENT_AMBULATORY_CARE_PROVIDER_SITE_OTHER): Payer: Medicare Other | Admitting: *Deleted

## 2016-09-17 DIAGNOSIS — Z95818 Presence of other cardiac implants and grafts: Secondary | ICD-10-CM

## 2016-09-18 ENCOUNTER — Encounter: Payer: Self-pay | Admitting: Podiatry

## 2016-09-18 ENCOUNTER — Ambulatory Visit (INDEPENDENT_AMBULATORY_CARE_PROVIDER_SITE_OTHER): Payer: Medicare Other | Admitting: Podiatry

## 2016-09-18 VITALS — Ht 66.0 in | Wt 135.0 lb

## 2016-09-18 DIAGNOSIS — L600 Ingrowing nail: Secondary | ICD-10-CM

## 2016-09-18 DIAGNOSIS — M79609 Pain in unspecified limb: Secondary | ICD-10-CM

## 2016-09-18 DIAGNOSIS — B351 Tinea unguium: Secondary | ICD-10-CM

## 2016-09-18 NOTE — Progress Notes (Signed)
Patient ID: Nancy [name], female   DOB: April 08, 1925, 82 y.o.   MRN: [id number] Complaint:  Visit Type: Patient returns to my office for continued preventative foot care services. Complaint: Patient states" my nails have grown long and thick and become painful to walk and wear shoes" . The patient presents for preventative foot care services. No changes to ROS.  Past history of nail surgery medial border right foot. She says she has CVA in July but has recovered well.  Podiatric Exam: Vascular: dorsalis pedis and posterior tibial pulses are palpable bilateral. Capillary return is immediate. Temperature gradient is WNL. Skin turgor WNL  Sensorium: Normal Semmes Weinstein monofilament test. Normal tactile sensation bilaterally. Nail Exam: Pt has thick disfigured discolored nails with subungual debris noted bilateral entire nail hallux  Ulcer Exam: There is no evidence of ulcer or pre-ulcerative changes or infection. Orthopedic Exam: Muscle tone and strength are WNL. No limitations in general ROM. No crepitus or effusions noted. Foot type and digits show no abnormalities. Bony prominences are unremarkable. Skin: No Porokeratosis. No infection or ulcers  Diagnosis:  Onychomycosis, , Pain in right toe, pain in left toes  Treatment & Plan Procedures and Treatment: Consent by patient was obtained for treatment procedures. The patient understood the discussion of treatment and procedures well. All questions were answered thoroughly reviewed. Debridement of mycotic and hypertrophic toenails, 1 through 5 bilateral and clearing of subungual debris. No ulceration, no infection noted.  Return Visit-Office Procedure: Patient instructed to return to the office for a follow up visit 3 months for continued evaluation and treatment....   Helane GuntherGregory Catalino Plascencia DPM

## 2016-09-18 NOTE — Progress Notes (Signed)
Carelink Summary Report / Loop Recorder 

## 2016-10-01 LAB — CUP PACEART REMOTE DEVICE CHECK
Date Time Interrogation Session: 20171113184013
MDC IDC PG IMPLANT DT: 20170815

## 2016-10-17 ENCOUNTER — Ambulatory Visit (INDEPENDENT_AMBULATORY_CARE_PROVIDER_SITE_OTHER): Payer: Medicare Other | Admitting: *Deleted

## 2016-10-17 DIAGNOSIS — Z95818 Presence of other cardiac implants and grafts: Secondary | ICD-10-CM

## 2016-10-20 NOTE — Progress Notes (Signed)
Carelink Summary Report / Loop Recorder 

## 2016-11-08 LAB — CUP PACEART REMOTE DEVICE CHECK
Implantable Pulse Generator Implant Date: 20170815
MDC IDC SESS DTM: 20171213191943

## 2016-11-08 NOTE — Progress Notes (Signed)
Carelink summary report received. Battery status OK. Normal device function. No new symptom episodes, tachy episodes, brady, or pause episodes. No new AF episodes. Monthly summary reports and ROV/PRN 

## 2016-11-17 ENCOUNTER — Ambulatory Visit (INDEPENDENT_AMBULATORY_CARE_PROVIDER_SITE_OTHER): Payer: Medicare Other | Admitting: *Deleted

## 2016-11-17 DIAGNOSIS — Z95818 Presence of other cardiac implants and grafts: Secondary | ICD-10-CM | POA: Diagnosis not present

## 2016-11-17 NOTE — Progress Notes (Signed)
Carelink Summary Report / Loop Recorder 

## 2016-11-27 LAB — CUP PACEART REMOTE DEVICE CHECK
Date Time Interrogation Session: 20180112193615
MDC IDC PG IMPLANT DT: 20170815

## 2016-12-10 LAB — CUP PACEART REMOTE DEVICE CHECK
Date Time Interrogation Session: 20180211194040
MDC IDC PG IMPLANT DT: 20170815

## 2016-12-11 ENCOUNTER — Encounter: Payer: Self-pay | Admitting: Podiatry

## 2016-12-11 ENCOUNTER — Ambulatory Visit (INDEPENDENT_AMBULATORY_CARE_PROVIDER_SITE_OTHER): Payer: Medicare Other | Admitting: Podiatry

## 2016-12-11 DIAGNOSIS — B351 Tinea unguium: Secondary | ICD-10-CM | POA: Diagnosis not present

## 2016-12-11 DIAGNOSIS — M79609 Pain in unspecified limb: Secondary | ICD-10-CM | POA: Diagnosis not present

## 2016-12-11 NOTE — Progress Notes (Signed)
Patient ID: Nancy [name], female   DOB: 06/13/1925, 83 y.o.   MRN: [id number] Complaint:  Visit Type: Patient returns to my office for continued preventative foot care services. Complaint: Patient states" my nails have grown long and thick and become painful to walk and wear shoes" . The patient presents for preventative foot care services. No changes to ROS.  Past history of nail surgery medial border right foot. She says she has CVA in July but has recovered well.  Podiatric Exam: Vascular: dorsalis pedis and posterior tibial pulses are palpable bilateral. Capillary return is immediate. Temperature gradient is WNL. Skin turgor WNL  Sensorium: Normal Semmes Weinstein monofilament test. Normal tactile sensation bilaterally. Nail Exam: Pt has thick disfigured discolored nails with subungual debris noted bilateral entire nail hallux  Ulcer Exam: There is no evidence of ulcer or pre-ulcerative changes or infection. Orthopedic Exam: Muscle tone and strength are WNL. No limitations in general ROM. No crepitus or effusions noted. Foot type and digits show no abnormalities. Bony prominences are unremarkable. Skin: No Porokeratosis. No infection or ulcers  Diagnosis:  Onychomycosis, , Pain in right toe, pain in left toes  Treatment & Plan Procedures and Treatment: Consent by patient was obtained for treatment procedures. The patient understood the discussion of treatment and procedures well. All questions were answered thoroughly reviewed. Debridement of mycotic and hypertrophic toenails, 1 through 5 bilateral and clearing of subungual debris. No ulceration, no infection noted.  Return Visit-Office Procedure: Patient instructed to return to the office for a follow up visit 3 months for continued evaluation and treatment....   Sade Hollon DPM 

## 2016-12-16 ENCOUNTER — Ambulatory Visit (INDEPENDENT_AMBULATORY_CARE_PROVIDER_SITE_OTHER): Payer: Medicare Other | Admitting: *Deleted

## 2016-12-16 DIAGNOSIS — I639 Cerebral infarction, unspecified: Secondary | ICD-10-CM

## 2016-12-17 NOTE — Progress Notes (Signed)
Carelink Summary Report / Loop Recorder 

## 2016-12-21 LAB — CUP PACEART REMOTE DEVICE CHECK
MDC IDC PG IMPLANT DT: 20170815
MDC IDC SESS DTM: 20180313194730

## 2016-12-21 NOTE — Progress Notes (Signed)
Carelink summary report received. Battery status OK. Normal device function. No new symptom episodes, tachy episodes, brady, or pause episodes. No new AF episodes. Monthly summary reports and ROV/PRN 

## 2017-01-15 ENCOUNTER — Ambulatory Visit (INDEPENDENT_AMBULATORY_CARE_PROVIDER_SITE_OTHER): Payer: Medicare Other | Admitting: *Deleted

## 2017-01-15 DIAGNOSIS — I639 Cerebral infarction, unspecified: Secondary | ICD-10-CM | POA: Diagnosis not present

## 2017-01-16 NOTE — Progress Notes (Signed)
Carelink Summary Report / Loop Recorder 

## 2017-01-26 LAB — CUP PACEART REMOTE DEVICE CHECK
Implantable Pulse Generator Implant Date: 20170815
MDC IDC SESS DTM: 20180413023243

## 2017-02-16 ENCOUNTER — Ambulatory Visit (INDEPENDENT_AMBULATORY_CARE_PROVIDER_SITE_OTHER): Payer: Medicare Other | Admitting: *Deleted

## 2017-02-16 DIAGNOSIS — I639 Cerebral infarction, unspecified: Secondary | ICD-10-CM

## 2017-02-16 NOTE — Progress Notes (Signed)
Carelink Summary Report / Loop Recorder 

## 2017-02-26 LAB — CUP PACEART REMOTE DEVICE CHECK
Implantable Pulse Generator Implant Date: 20170815
MDC IDC SESS DTM: 20180512224124

## 2017-03-16 ENCOUNTER — Ambulatory Visit (INDEPENDENT_AMBULATORY_CARE_PROVIDER_SITE_OTHER): Payer: Medicare Other | Admitting: *Deleted

## 2017-03-16 DIAGNOSIS — I639 Cerebral infarction, unspecified: Secondary | ICD-10-CM | POA: Diagnosis not present

## 2017-03-17 DIAGNOSIS — I639 Cerebral infarction, unspecified: Secondary | ICD-10-CM | POA: Diagnosis not present

## 2017-03-17 NOTE — Progress Notes (Signed)
Carelink Summary Report / Loop Recorder 

## 2017-03-18 ENCOUNTER — Ambulatory Visit (INDEPENDENT_AMBULATORY_CARE_PROVIDER_SITE_OTHER): Payer: Medicare Other | Admitting: Podiatry

## 2017-03-18 DIAGNOSIS — B351 Tinea unguium: Secondary | ICD-10-CM | POA: Diagnosis not present

## 2017-03-18 DIAGNOSIS — M79609 Pain in unspecified limb: Secondary | ICD-10-CM | POA: Diagnosis not present

## 2017-03-18 NOTE — Progress Notes (Signed)
Patient ID: Nancy [name], female   DOB: 01/07/1925, 83 y.o.   MRN: [id number] Complaint:  Visit Type: Patient returns to my office for continued preventative foot care services. Complaint: Patient states" my nails have grown long and thick and become painful to walk and wear shoes" . The patient presents for preventative foot care services. No changes to ROS.  Past history of nail surgery medial border right foot. She says she has CVA in July but has recovered well.  Podiatric Exam: Vascular: dorsalis pedis and posterior tibial pulses are palpable bilateral. Capillary return is immediate. Temperature gradient is WNL. Skin turgor WNL  Sensorium: Normal Semmes Weinstein monofilament test. Normal tactile sensation bilaterally. Nail Exam: Pt has thick disfigured discolored nails with subungual debris noted bilateral entire nail hallux  Ulcer Exam: There is no evidence of ulcer or pre-ulcerative changes or infection. Orthopedic Exam: Muscle tone and strength are WNL. No limitations in general ROM. No crepitus or effusions noted. Foot type and digits show no abnormalities. Bony prominences are unremarkable. Skin: No Porokeratosis. No infection or ulcers  Diagnosis:  Onychomycosis, , Pain in right toe, pain in left toes  Treatment & Plan Procedures and Treatment: Consent by patient was obtained for treatment procedures. The patient understood the discussion of treatment and procedures well. All questions were answered thoroughly reviewed. Debridement of mycotic and hypertrophic toenails, 1 through 5 bilateral and clearing of subungual debris. No ulceration, no infection noted.  Return Visit-Office Procedure: Patient instructed to return to the office for a follow up visit 3 months for continued evaluation and treatment....   Farrah Skoda DPM 

## 2017-03-25 LAB — CUP PACEART REMOTE DEVICE CHECK
Date Time Interrogation Session: 20180611224039
MDC IDC PG IMPLANT DT: 20170815

## 2017-03-25 NOTE — Progress Notes (Signed)
Carelink summary report received. Battery status OK. Normal device function. No new symptom episodes, tachy episodes, brady, or pause episodes. No new AF episodes. Monthly summary reports and ROV/PRN 

## 2017-04-15 ENCOUNTER — Ambulatory Visit (INDEPENDENT_AMBULATORY_CARE_PROVIDER_SITE_OTHER): Payer: Medicare Other | Admitting: *Deleted

## 2017-04-15 DIAGNOSIS — I639 Cerebral infarction, unspecified: Secondary | ICD-10-CM

## 2017-04-16 NOTE — Progress Notes (Signed)
Carelink Summary Report / Loop Recorder 

## 2017-04-24 LAB — CUP PACEART REMOTE DEVICE CHECK
Date Time Interrogation Session: 20180712001010
MDC IDC PG IMPLANT DT: 20170815

## 2017-05-15 ENCOUNTER — Ambulatory Visit (INDEPENDENT_AMBULATORY_CARE_PROVIDER_SITE_OTHER): Payer: Medicare Other | Admitting: *Deleted

## 2017-05-15 DIAGNOSIS — I639 Cerebral infarction, unspecified: Secondary | ICD-10-CM | POA: Diagnosis not present

## 2017-05-22 LAB — CUP PACEART REMOTE DEVICE CHECK
Implantable Pulse Generator Implant Date: 20170815
MDC IDC SESS DTM: 20180811004003

## 2017-06-15 ENCOUNTER — Ambulatory Visit (INDEPENDENT_AMBULATORY_CARE_PROVIDER_SITE_OTHER): Payer: Medicare Other | Admitting: *Deleted

## 2017-06-15 DIAGNOSIS — I639 Cerebral infarction, unspecified: Secondary | ICD-10-CM | POA: Diagnosis not present

## 2017-06-15 NOTE — Progress Notes (Signed)
Carelink Summary Report / Loop Recorder 

## 2017-06-17 ENCOUNTER — Ambulatory Visit (INDEPENDENT_AMBULATORY_CARE_PROVIDER_SITE_OTHER): Payer: Medicare Other | Admitting: Podiatry

## 2017-06-17 ENCOUNTER — Encounter: Payer: Self-pay | Admitting: Podiatry

## 2017-06-17 DIAGNOSIS — L600 Ingrowing nail: Secondary | ICD-10-CM

## 2017-06-17 DIAGNOSIS — B351 Tinea unguium: Secondary | ICD-10-CM

## 2017-06-17 DIAGNOSIS — M79609 Pain in unspecified limb: Secondary | ICD-10-CM | POA: Diagnosis not present

## 2017-06-17 LAB — CUP PACEART REMOTE DEVICE CHECK
Date Time Interrogation Session: 20180910004406
MDC IDC PG IMPLANT DT: 20170815

## 2017-06-17 NOTE — Progress Notes (Signed)
Patient ID: Nancy GranaJoyce L Andreen, female   DOB: [birth year], 83 y.o.   MRN: [id number] Complaint:  Visit Type: Patient returns to my office for continued preventative foot care services. Complaint: Patient states" my nails have grown long and thick and become painful to walk and wear shoes" . The patient presents for preventative foot care services. No changes to ROS.  Past history of nail surgery medial border right foot. She says she has CVA in July but has recovered well.  Podiatric Exam: Vascular: dorsalis pedis and posterior tibial pulses are palpable bilateral. Capillary return is immediate. Temperature gradient is WNL. Skin turgor WNL  Sensorium: Normal Semmes Weinstein monofilament test. Normal tactile sensation bilaterally. Nail Exam: Pt has thick disfigured discolored nails with subungual debris noted bilateral entire nail hallux  Ulcer Exam: There is no evidence of ulcer or pre-ulcerative changes or infection. Orthopedic Exam: Muscle tone and strength are WNL. No limitations in general ROM. No crepitus or effusions noted. Foot type and digits show no abnormalities. Bony prominences are unremarkable. Skin: No Porokeratosis. No infection or ulcers  Diagnosis:  Onychomycosis, , Pain in right toe, pain in left toes  Treatment & Plan Procedures and Treatment: Consent by patient was obtained for treatment procedures. The patient understood the discussion of treatment and procedures well. All questions were answered thoroughly reviewed. Debridement of mycotic and hypertrophic toenails, 1 through 5 bilateral and clearing of subungual debris. No ulceration, no infection noted.  Return Visit-Office Procedure: Patient instructed to return to the office for a follow up visit 3 months for continued evaluation and treatment....   Helane GuntherGregory Decarlo Rivet DPM

## 2017-07-14 ENCOUNTER — Ambulatory Visit (INDEPENDENT_AMBULATORY_CARE_PROVIDER_SITE_OTHER): Payer: Medicare Other | Admitting: *Deleted

## 2017-07-14 DIAGNOSIS — I639 Cerebral infarction, unspecified: Secondary | ICD-10-CM | POA: Diagnosis not present

## 2017-07-15 NOTE — Progress Notes (Signed)
Carelink Summary Report / Loop Recorder 

## 2017-07-16 LAB — CUP PACEART REMOTE DEVICE CHECK
Implantable Pulse Generator Implant Date: 20170815
MDC IDC SESS DTM: 20181010014058

## 2017-08-13 ENCOUNTER — Ambulatory Visit (INDEPENDENT_AMBULATORY_CARE_PROVIDER_SITE_OTHER): Payer: Medicare Other | Admitting: *Deleted

## 2017-08-13 DIAGNOSIS — I639 Cerebral infarction, unspecified: Secondary | ICD-10-CM

## 2017-08-14 NOTE — Progress Notes (Signed)
Carelink Summary Report / Loop Recorder 

## 2017-08-20 LAB — CUP PACEART REMOTE DEVICE CHECK
Date Time Interrogation Session: 20181109023842
MDC IDC PG IMPLANT DT: 20170815

## 2017-09-14 ENCOUNTER — Ambulatory Visit (INDEPENDENT_AMBULATORY_CARE_PROVIDER_SITE_OTHER): Payer: Medicare Other | Admitting: *Deleted

## 2017-09-14 DIAGNOSIS — I639 Cerebral infarction, unspecified: Secondary | ICD-10-CM

## 2017-09-14 NOTE — Progress Notes (Signed)
Carelink Summary Report / Loop Recorder 

## 2017-09-16 ENCOUNTER — Encounter: Payer: Self-pay | Admitting: Podiatry

## 2017-09-16 ENCOUNTER — Ambulatory Visit: Payer: Medicare Other | Admitting: Podiatry

## 2017-09-16 DIAGNOSIS — B351 Tinea unguium: Secondary | ICD-10-CM

## 2017-09-16 DIAGNOSIS — M79609 Pain in unspecified limb: Secondary | ICD-10-CM

## 2017-09-16 NOTE — Progress Notes (Signed)
Patient ID: Nancy [name], female   DOB: 11/10/1924, 83 y.o.   MRN: [id number] Complaint:  Visit Type: Patient returns to my office for continued preventative foot care services. Complaint: Patient states" my nails have grown long and thick and become painful to walk and wear shoes" . The patient presents for preventative foot care services. No changes to ROS.  Past history of nail surgery medial border right foot. She says she has CVA in July but has recovered well.  Podiatric Exam: Vascular: dorsalis pedis and posterior tibial pulses are palpable bilateral. Capillary return is immediate. Temperature gradient is WNL. Skin turgor WNL  Sensorium: Normal Semmes Weinstein monofilament test. Normal tactile sensation bilaterally. Nail Exam: Pt has thick disfigured discolored nails with subungual debris noted bilateral entire nail hallux  Ulcer Exam: There is no evidence of ulcer or pre-ulcerative changes or infection. Orthopedic Exam: Muscle tone and strength are WNL. No limitations in general ROM. No crepitus or effusions noted. Foot type and digits show no abnormalities. Bony prominences are unremarkable. Skin: No Porokeratosis. No infection or ulcers  Diagnosis:  Onychomycosis, , Pain in right toe, pain in left toes  Treatment & Plan Procedures and Treatment: Consent by patient was obtained for treatment procedures. The patient understood the discussion of treatment and procedures well. All questions were answered thoroughly reviewed. Debridement of mycotic and hypertrophic toenails, 1 through 5 bilateral and clearing of subungual debris. No ulceration, no infection noted.  Return Visit-Office Procedure: Patient instructed to return to the office for a follow up visit 3 months for continued evaluation and treatment....   Kaycee Haycraft DPM 

## 2017-09-17 LAB — CUP PACEART REMOTE DEVICE CHECK
Date Time Interrogation Session: 20181209024107
MDC IDC PG IMPLANT DT: 20170815

## 2017-10-05 IMAGING — US US EXTREM LOW VENOUS*L*
1 series · 13 of 24 positions shown · non-contrast
Comparison: None.

CLINICAL DATA: 89-year-old female with a history of left lower
extremity swelling



[Series 1: us extrem low venous*left* · 0.06mm/px · 13 of 61 slices shown]
[im 1/61]
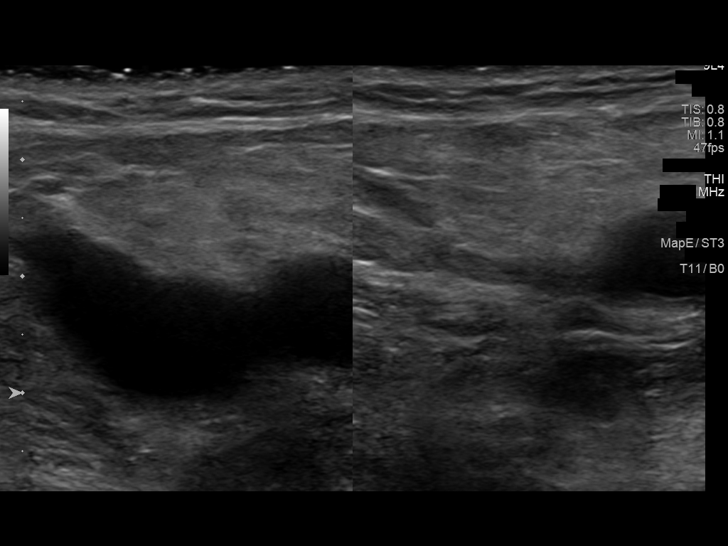
[im 6/61]
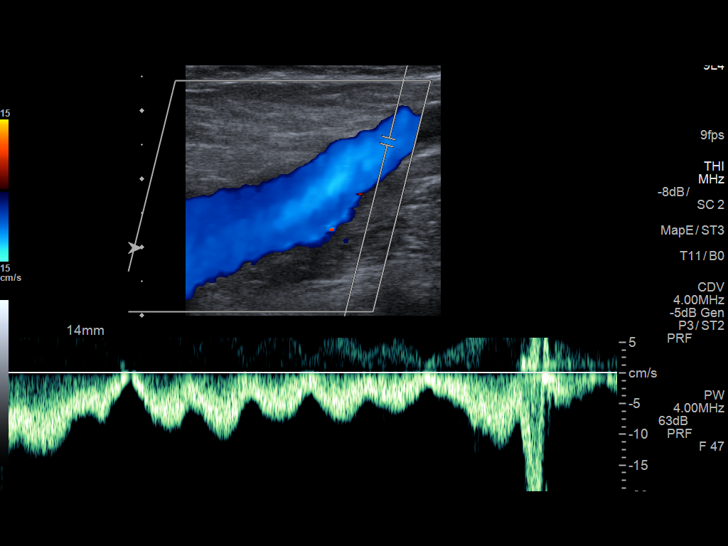
[im 11/61]
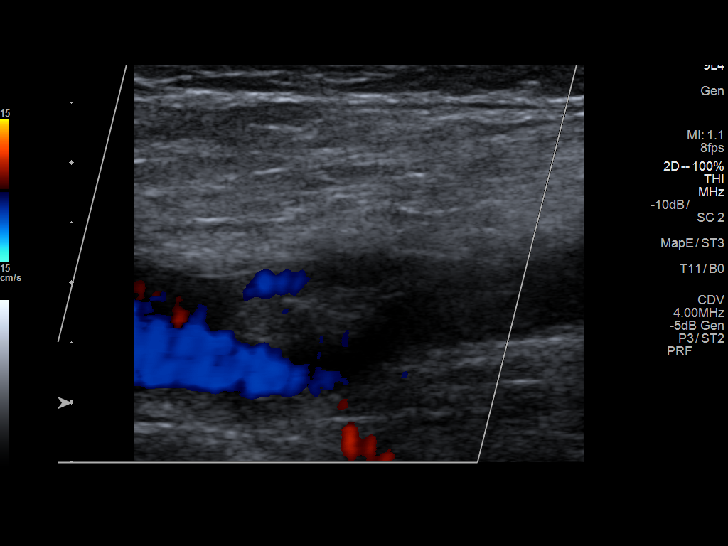
[im 16/61]
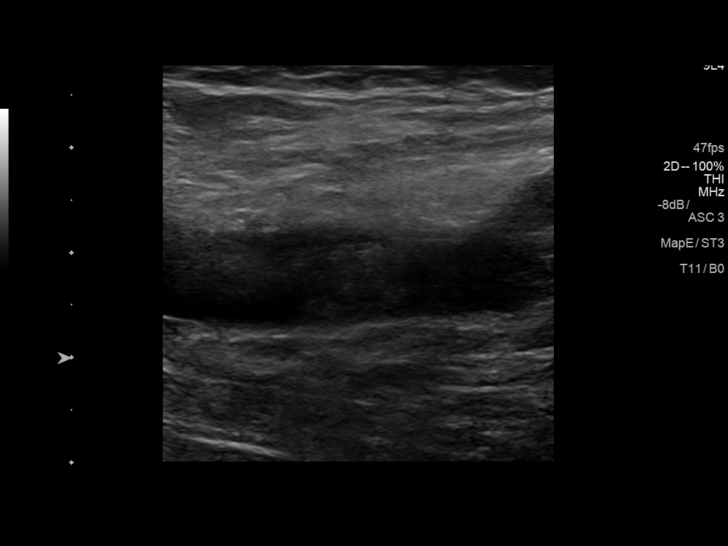
[im 21/61]
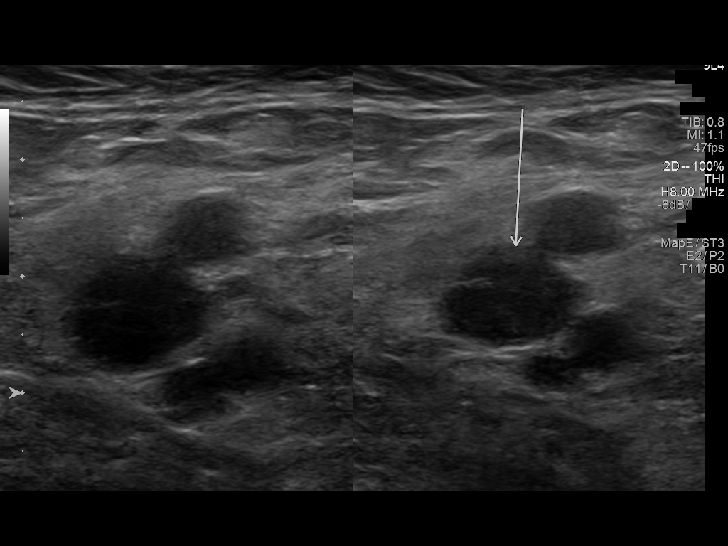
[im 27/61]
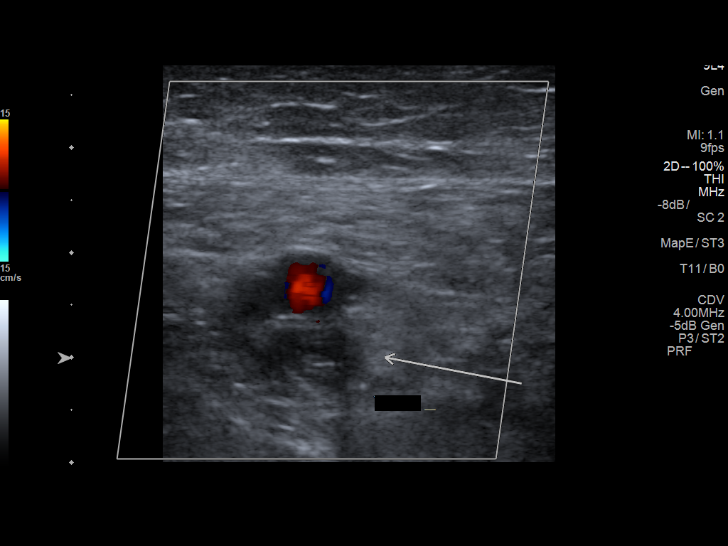
[im 32/61]
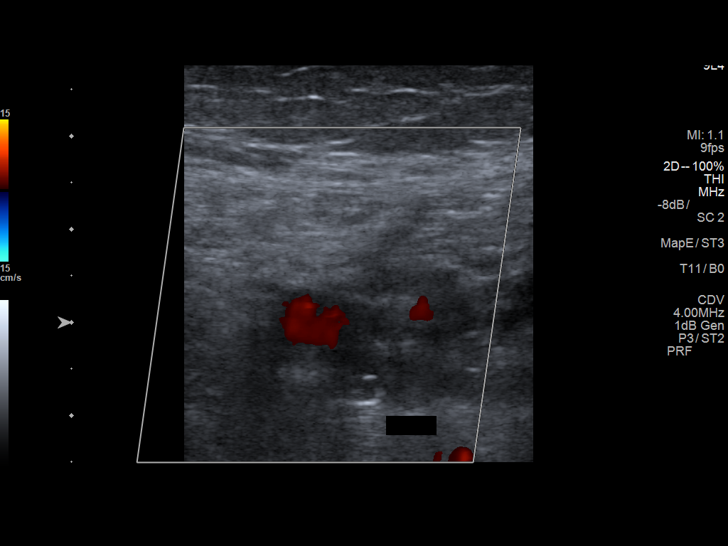
[im 34/61]
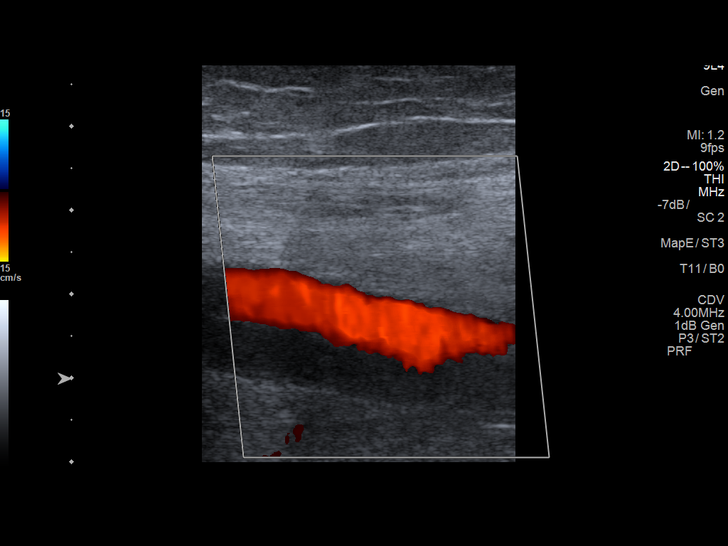
[im 40/61]
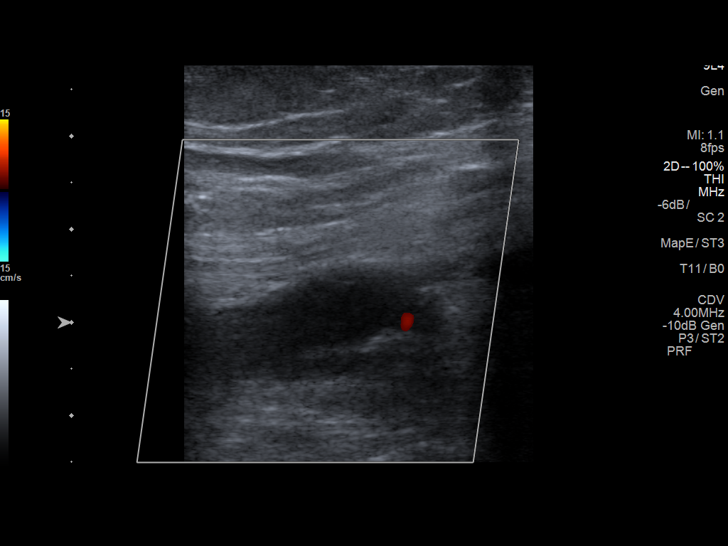
[im 45/61]
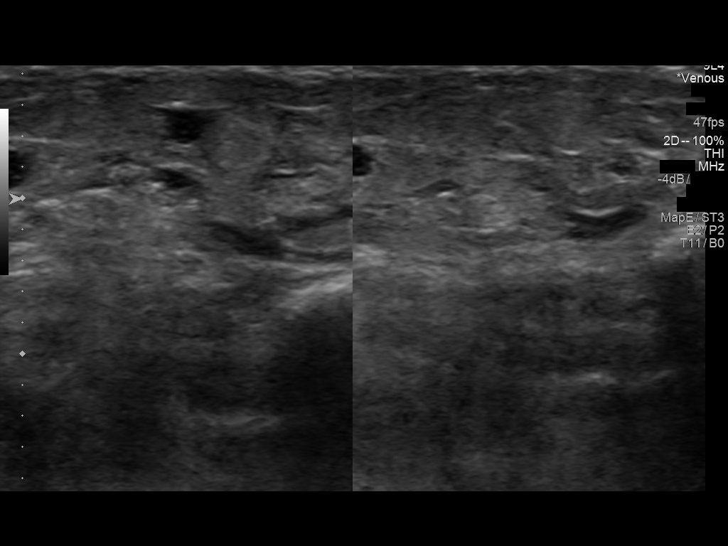
[im 50/61]
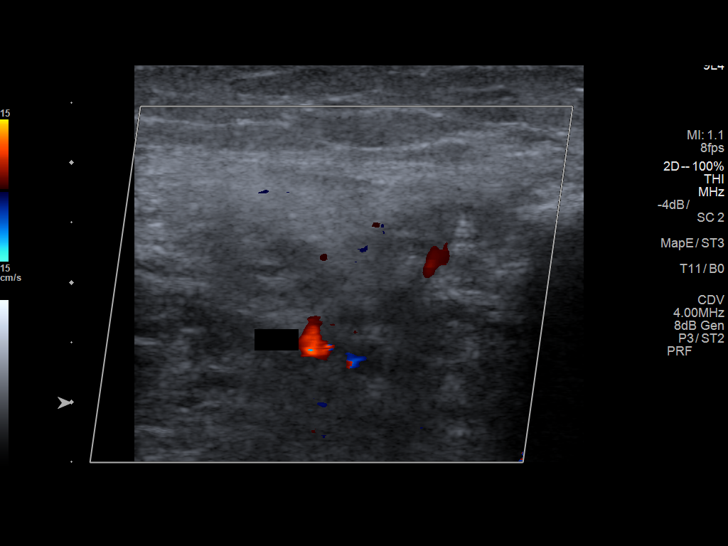
[im 55/61]
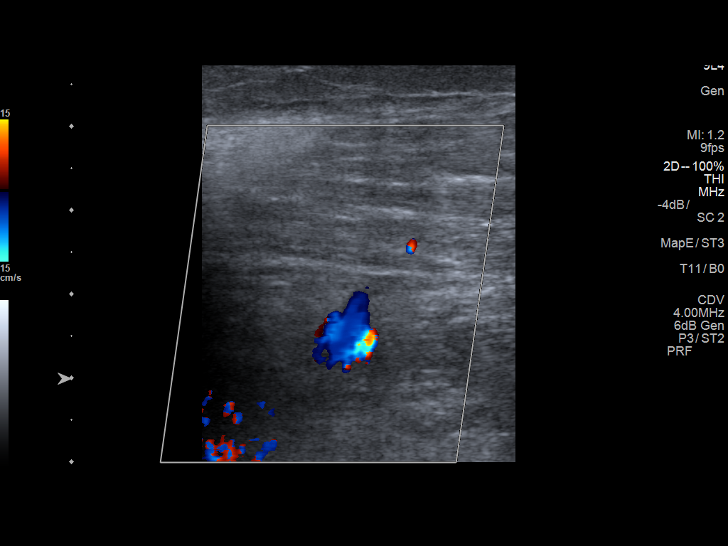
[im 61/61]
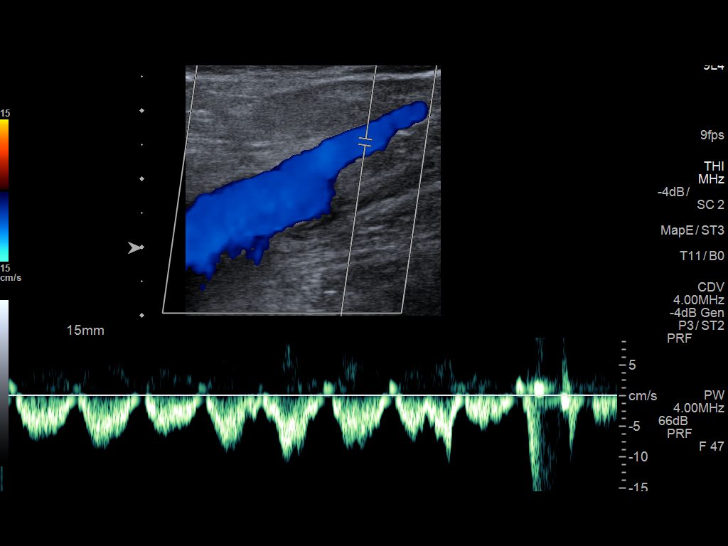

[13 of 24 positions shown; findings below may reference images not displayed]

FINDINGS: Contralateral Common Femoral Vein: Respiratory phasicity is normal
and symmetric with the symptomatic side. No evidence of thrombus.
Normal compressibility.

Common Femoral Vein: Nonocclusive thrombus of the common femoral
vein with flow maintained. Incompletely compressible. Respiratory
phasicity is present in the patent common femoral vein.

Saphenofemoral Junction: No evidence of thrombus. Normal
compressibility and flow on color Doppler imaging.

Profunda Femoral Vein: No evidence of thrombus. Normal
compressibility and flow on color Doppler imaging.

Femoral Vein: Occlusive thrombus present throughout the length of
the common femoral vein extending into the popliteal vein.

Popliteal Vein: Occlusive thrombus present throughout length of the
popliteal vein into the proximal posterior calf veins.

Calf Veins: Thrombus extends from the popliteal veins into the
proximal posterior tibial vein and peroneal veins with incomplete
compressibility.

Superficial Great Saphenous Vein: No evidence of thrombus. Normal
compressibility and flow on color Doppler imaging.

Other Findings:  None.
IMPRESSION: Sonographic survey of the left lower extremity is positive for
occlusive DVT throughout the length of the femoral vein into the
popliteal vein. Nonocclusive thrombus extends more proximally into
the common femoral vein, and distally into the calf veins.

## 2017-10-12 ENCOUNTER — Ambulatory Visit (INDEPENDENT_AMBULATORY_CARE_PROVIDER_SITE_OTHER): Payer: Medicare Other | Admitting: *Deleted

## 2017-10-12 DIAGNOSIS — I639 Cerebral infarction, unspecified: Secondary | ICD-10-CM

## 2017-10-13 NOTE — Progress Notes (Signed)
Carelink Summary Report / Loop Recorder 

## 2017-10-23 LAB — CUP PACEART REMOTE DEVICE CHECK
Date Time Interrogation Session: 20190108031454
MDC IDC PG IMPLANT DT: 20170815

## 2017-11-11 ENCOUNTER — Ambulatory Visit (INDEPENDENT_AMBULATORY_CARE_PROVIDER_SITE_OTHER): Payer: Medicare Other | Admitting: *Deleted

## 2017-11-11 DIAGNOSIS — I639 Cerebral infarction, unspecified: Secondary | ICD-10-CM | POA: Diagnosis not present

## 2017-11-12 NOTE — Progress Notes (Signed)
Carelink Summary Report / Loop Recorder 

## 2017-12-04 LAB — CUP PACEART REMOTE DEVICE CHECK
Implantable Pulse Generator Implant Date: 20170815
MDC IDC SESS DTM: 20190207034305

## 2017-12-14 ENCOUNTER — Ambulatory Visit (INDEPENDENT_AMBULATORY_CARE_PROVIDER_SITE_OTHER): Payer: Medicare Other | Admitting: *Deleted

## 2017-12-14 DIAGNOSIS — I639 Cerebral infarction, unspecified: Secondary | ICD-10-CM | POA: Diagnosis not present

## 2017-12-15 NOTE — Progress Notes (Signed)
Carelink Summary Report / Loop Recorder 

## 2017-12-16 ENCOUNTER — Encounter: Payer: Self-pay | Admitting: Podiatry

## 2017-12-16 ENCOUNTER — Ambulatory Visit: Payer: Medicare Other | Admitting: Podiatry

## 2017-12-16 DIAGNOSIS — M79609 Pain in unspecified limb: Secondary | ICD-10-CM | POA: Diagnosis not present

## 2017-12-16 DIAGNOSIS — B351 Tinea unguium: Secondary | ICD-10-CM | POA: Diagnosis not present

## 2017-12-16 NOTE — Progress Notes (Signed)
Patient ID: Nancy [name], female   DOB: 1925/09/03, 84 y.o.   MRN: [id number] Complaint:  Visit Type: Patient returns to my office for continued preventative foot care services. Complaint: Patient states" my nails have grown long and thick and become painful to walk and wear shoes" . The patient presents for preventative foot care services. No changes to ROS.  Past history of nail surgery medial border right foot. She says she has CVA in July but has recovered well.  Podiatric Exam: Vascular: dorsalis pedis and posterior tibial pulses are palpable bilateral. Capillary return is immediate. Temperature gradient is WNL. Skin turgor WNL  Sensorium: Normal Semmes Weinstein monofilament test. Normal tactile sensation bilaterally. Nail Exam: Pt has thick disfigured discolored nails with subungual debris noted bilateral entire nail hallux  Ulcer Exam: There is no evidence of ulcer or pre-ulcerative changes or infection. Orthopedic Exam: Muscle tone and strength are WNL. No limitations in general ROM. No crepitus or effusions noted. Foot type and digits show no abnormalities. Bony prominences are unremarkable. Skin: No Porokeratosis. No infection or ulcers  Diagnosis:  Onychomycosis, , Pain in right toe, pain in left toes  Treatment & Plan Procedures and Treatment: Consent by patient was obtained for treatment procedures. The patient understood the discussion of treatment and procedures well. All questions were answered thoroughly reviewed. Debridement of mycotic and hypertrophic toenails, 1 through 5 bilateral and clearing of subungual debris. No ulceration, no infection noted.  Return Visit-Office Procedure: Patient instructed to return to the office for a follow up visit 4 months for continued evaluation and treatment....   Helane GuntherGregory Shamonique Battiste DPM

## 2018-01-18 ENCOUNTER — Ambulatory Visit (INDEPENDENT_AMBULATORY_CARE_PROVIDER_SITE_OTHER): Payer: Medicare Other | Admitting: *Deleted

## 2018-01-18 DIAGNOSIS — I639 Cerebral infarction, unspecified: Secondary | ICD-10-CM | POA: Diagnosis not present

## 2018-01-18 NOTE — Progress Notes (Signed)
Carelink Summary Report / Loop Recorder 

## 2018-01-21 LAB — CUP PACEART REMOTE DEVICE CHECK
MDC IDC PG IMPLANT DT: 20170815
MDC IDC SESS DTM: 20190312081143

## 2018-02-19 ENCOUNTER — Ambulatory Visit (INDEPENDENT_AMBULATORY_CARE_PROVIDER_SITE_OTHER): Payer: Medicare Other | Admitting: *Deleted

## 2018-02-19 DIAGNOSIS — I639 Cerebral infarction, unspecified: Secondary | ICD-10-CM

## 2018-02-19 NOTE — Progress Notes (Signed)
Carelink Summary Report / Loop Recorder 

## 2018-02-22 LAB — CUP PACEART REMOTE DEVICE CHECK
Implantable Pulse Generator Implant Date: 20170815
MDC IDC SESS DTM: 20190414083521

## 2018-03-16 LAB — CUP PACEART REMOTE DEVICE CHECK
Date Time Interrogation Session: 20190517114035
Implantable Pulse Generator Implant Date: 20170815

## 2018-03-24 ENCOUNTER — Ambulatory Visit (INDEPENDENT_AMBULATORY_CARE_PROVIDER_SITE_OTHER): Payer: Medicare Other | Admitting: *Deleted

## 2018-03-24 DIAGNOSIS — I639 Cerebral infarction, unspecified: Secondary | ICD-10-CM | POA: Diagnosis not present

## 2018-03-24 NOTE — Progress Notes (Signed)
Carelink Summary Report / Loop Recorder 

## 2018-04-13 ENCOUNTER — Encounter: Payer: Self-pay | Admitting: Podiatry

## 2018-04-13 ENCOUNTER — Ambulatory Visit: Payer: Medicare Other | Admitting: Podiatry

## 2018-04-13 DIAGNOSIS — B351 Tinea unguium: Secondary | ICD-10-CM | POA: Diagnosis not present

## 2018-04-13 DIAGNOSIS — M79609 Pain in unspecified limb: Secondary | ICD-10-CM | POA: Diagnosis not present

## 2018-04-13 NOTE — Progress Notes (Signed)
Patient ID: Nancy GranaJoyce L Lubben, female   DOB: 14-Dec-1924, 84 y.o.   MRN: [id number] Complaint:  Visit Type: Patient returns to my office for continued preventative foot care services. Complaint: Patient states" my nails have grown long and thick and become painful to walk and wear shoes" . The patient presents for preventative foot care services. No changes to ROS.  Past history of nail surgery medial border right foot. She says she has CVA in July but has recovered well.  Podiatric Exam: Vascular: dorsalis pedis and posterior tibial pulses are palpable bilateral. Capillary return is immediate. Temperature gradient is WNL. Skin turgor WNL  Sensorium: Normal Semmes Weinstein monofilament test. Normal tactile sensation bilaterally. Nail Exam: Pt has thick disfigured discolored nails with subungual debris noted bilateral entire nail hallux  Ulcer Exam: There is no evidence of ulcer or pre-ulcerative changes or infection. Orthopedic Exam: Muscle tone and strength are WNL. No limitations in general ROM. No crepitus or effusions noted. Foot type and digits show no abnormalities. Bony prominences are unremarkable. Skin: No Porokeratosis. No infection or ulcers  Diagnosis:  Onychomycosis, , Pain in right toe, pain in left toes  Treatment & Plan Procedures and Treatment: Consent by patient was obtained for treatment procedures. The patient understood the discussion of treatment and procedures well. All questions were answered thoroughly reviewed. Debridement of mycotic and hypertrophic toenails, 1 through 5 bilateral and clearing of subungual debris. No ulceration, no infection noted.  Return Visit-Office Procedure: Patient instructed to return to the office for a follow up visit 4 months for continued evaluation and treatment....   Helane GuntherGregory Daiya Tamer DPM

## 2018-04-26 ENCOUNTER — Ambulatory Visit (INDEPENDENT_AMBULATORY_CARE_PROVIDER_SITE_OTHER): Payer: Medicare Other | Admitting: *Deleted

## 2018-04-26 DIAGNOSIS — I639 Cerebral infarction, unspecified: Secondary | ICD-10-CM

## 2018-04-26 NOTE — Progress Notes (Signed)
Carelink Summary Report / Loop Recorder 

## 2018-04-28 LAB — CUP PACEART REMOTE DEVICE CHECK
Implantable Pulse Generator Implant Date: 20170815
MDC IDC SESS DTM: 20190619120814

## 2018-05-31 ENCOUNTER — Ambulatory Visit (INDEPENDENT_AMBULATORY_CARE_PROVIDER_SITE_OTHER): Payer: Medicare Other | Admitting: *Deleted

## 2018-05-31 DIAGNOSIS — I639 Cerebral infarction, unspecified: Secondary | ICD-10-CM

## 2018-05-31 NOTE — Progress Notes (Signed)
Carelink Summary Report / Loop Recorder 

## 2018-06-10 LAB — CUP PACEART REMOTE DEVICE CHECK
Date Time Interrogation Session: 20190722121109
Implantable Pulse Generator Implant Date: 20170815

## 2018-06-29 LAB — CUP PACEART REMOTE DEVICE CHECK
Implantable Pulse Generator Implant Date: 20170815
MDC IDC SESS DTM: 20190824123817

## 2018-07-01 ENCOUNTER — Ambulatory Visit (INDEPENDENT_AMBULATORY_CARE_PROVIDER_SITE_OTHER): Payer: Medicare Other | Admitting: *Deleted

## 2018-07-01 DIAGNOSIS — I639 Cerebral infarction, unspecified: Secondary | ICD-10-CM

## 2018-07-02 NOTE — Progress Notes (Signed)
Carelink Summary Report / Loop Recorder 

## 2018-07-05 LAB — CUP PACEART REMOTE DEVICE CHECK
Date Time Interrogation Session: 20190926141020
Implantable Pulse Generator Implant Date: 20170815

## 2018-07-14 IMAGING — US US CAROTID DUPLEX BILAT
1 series · 13 of 24 positions shown · non-contrast
Comparison: Brain MRI/ [facility] 04/30/2016

CLINICAL DATA: 89-year-old female with right-sided numbness and
dizziness as well as a punctate left cerebellar infarct

EXAM:
BILATERAL CAROTID DUPLEX ULTRASOUND
TECHNIQUE: Gray scale imaging, color Doppler and duplex ultrasound were
performed of bilateral carotid and vertebral arteries in the neck.

[Series 1: us carotid duplex bilat · 0.06mm/px · 13 of 68 slices shown]
[im 1/68]
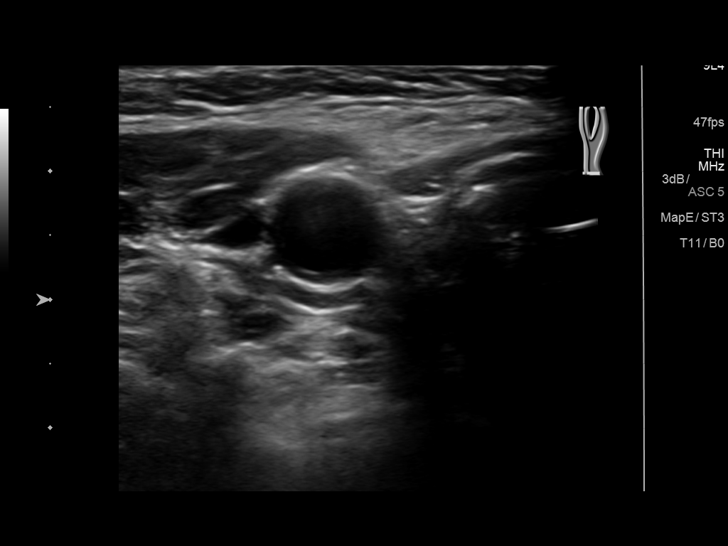
[im 6/68]
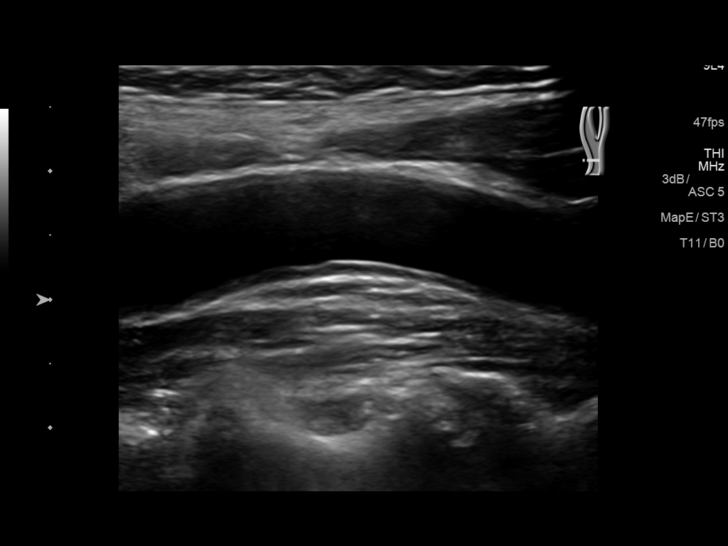
[im 12/68]
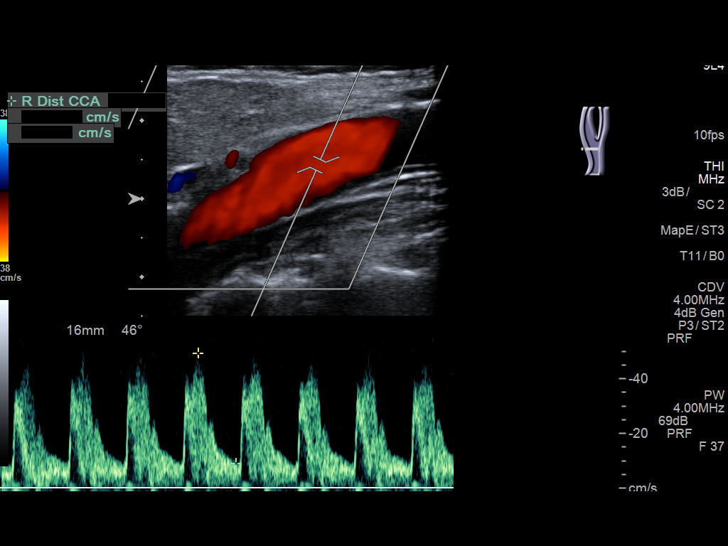
[im 18/68]
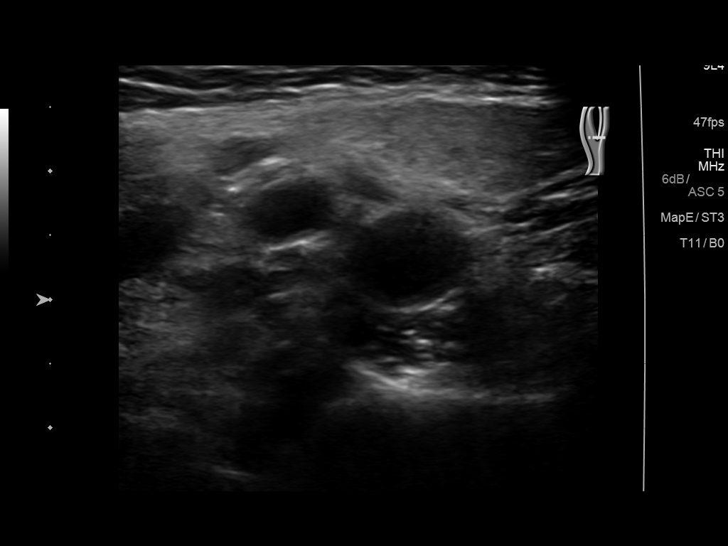
[im 24/68]
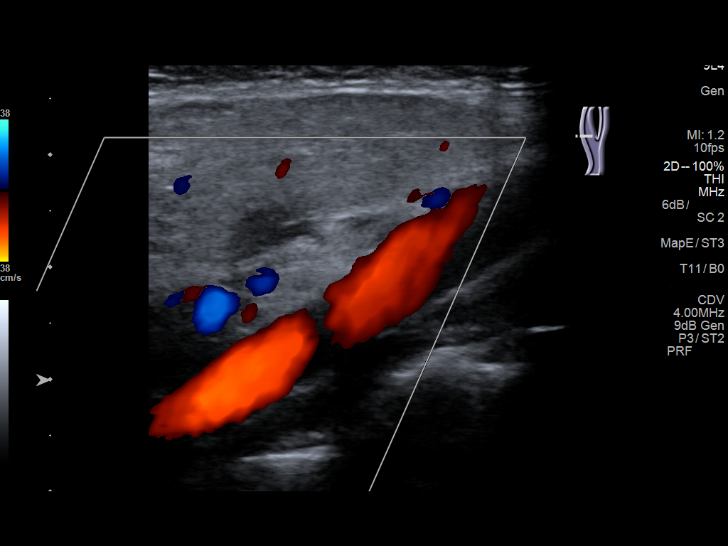
[im 30/68]
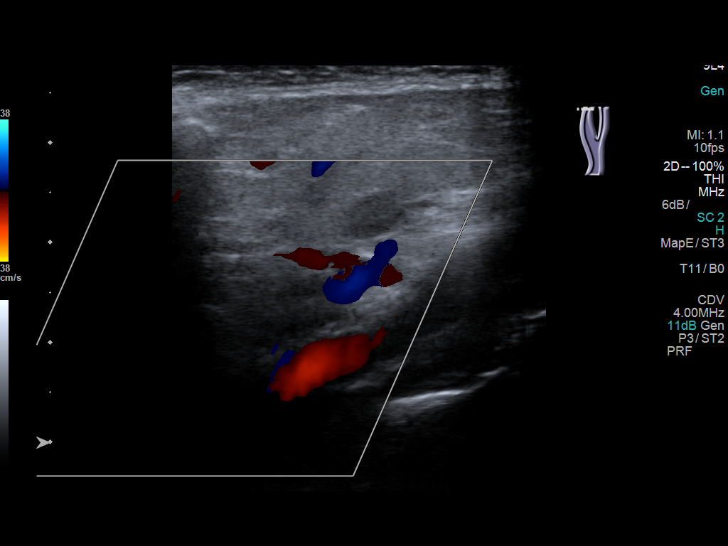
[im 35/68]
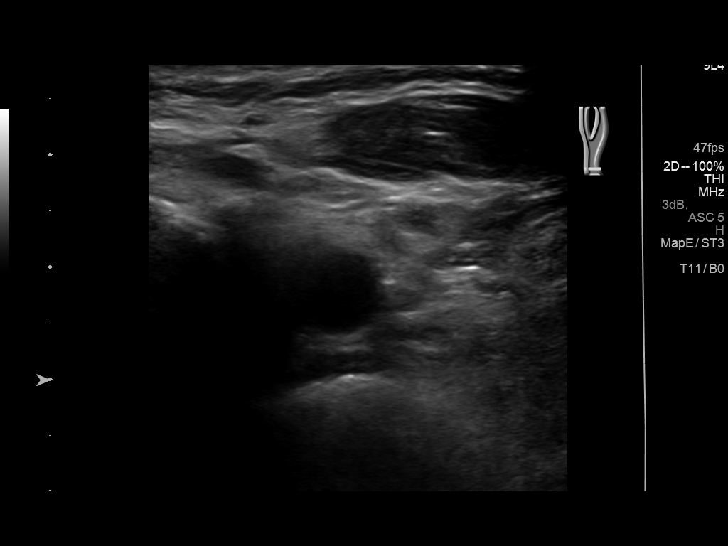
[im 38/68]
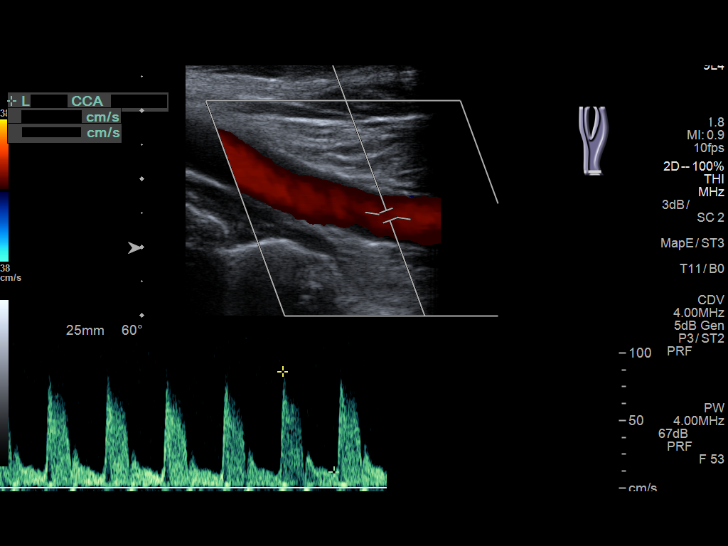
[im 44/68]
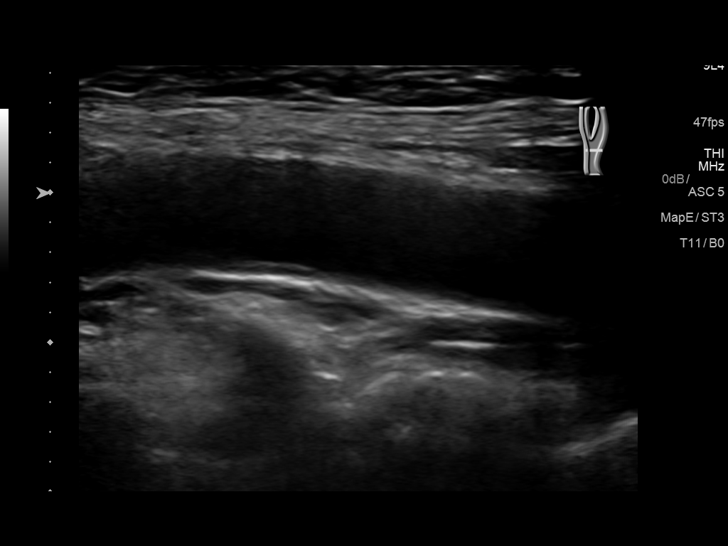
[im 50/68]
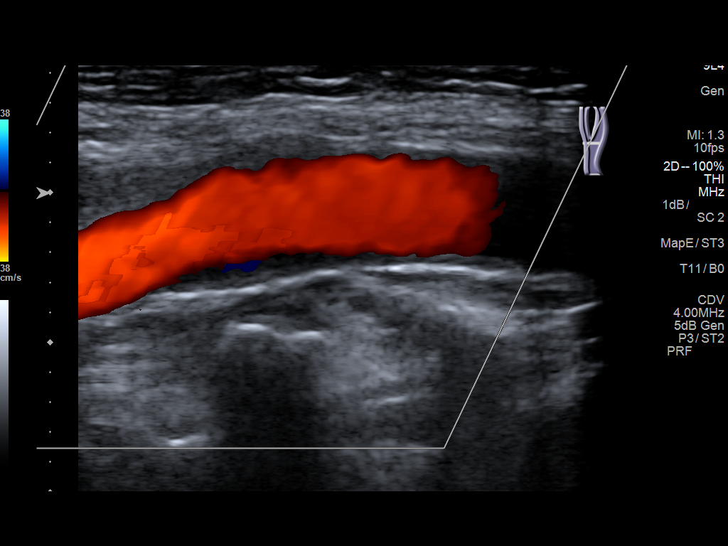
[im 56/68]
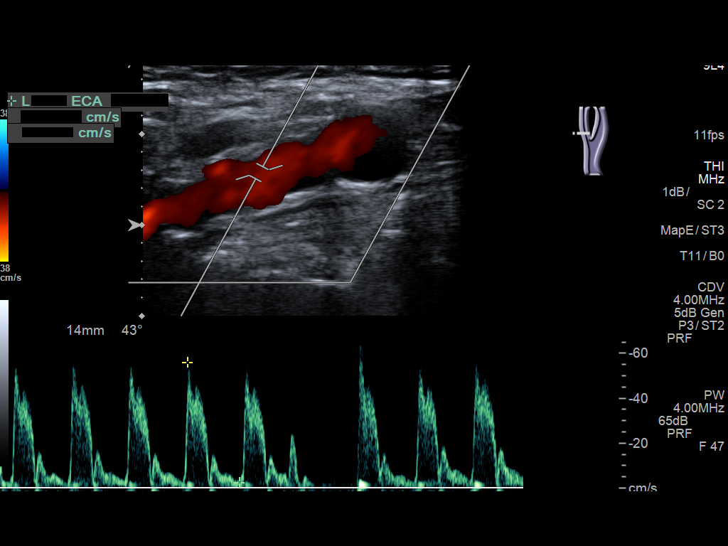
[im 62/68]
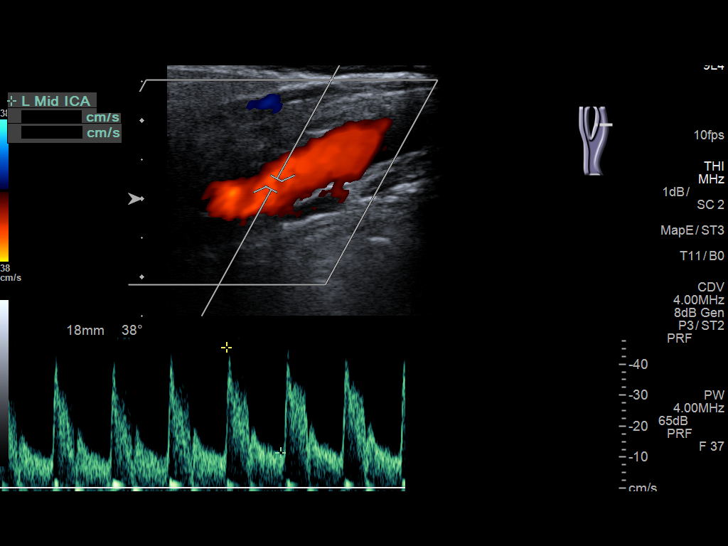
[im 68/68]
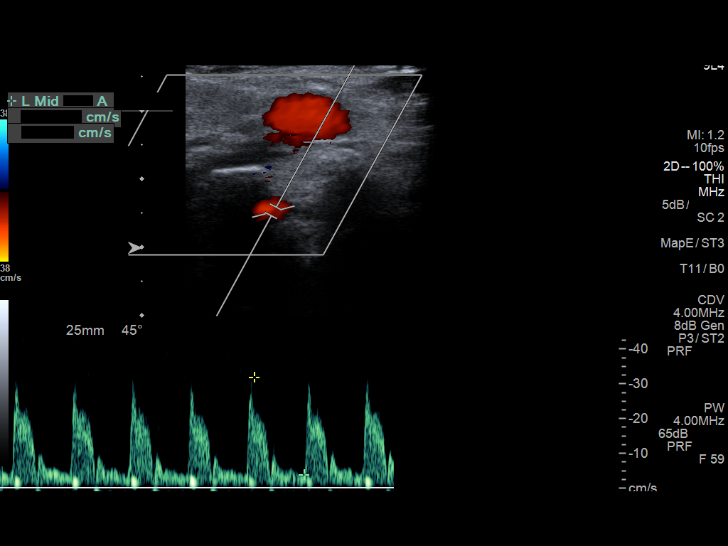

[13 of 24 positions shown; findings below may reference images not displayed]

FINDINGS: Criteria: Quantification of carotid stenosis is based on velocity
parameters that correlate the residual internal carotid diameter
with NASCET-based stenosis levels, using the diameter of the distal
internal carotid lumen as the denominator for stenosis measurement.

The following velocity measurements were obtained:

RIGHT

ICA:  70/19 cm/sec

CCA:  72/12 cm/sec

SYSTOLIC ICA/CCA RATIO:

DIASTOLIC ICA/CCA RATIO:

ECA:  71 cm/sec

LEFT

ICA:  63/17 cm/sec

CCA:  68/10 cm/sec

SYSTOLIC ICA/CCA RATIO:

DIASTOLIC ICA/CCA RATIO:

ECA:  56 cm/sec

RIGHT CAROTID ARTERY: No significant atherosclerotic plaque or
evidence of stenosis.

RIGHT VERTEBRAL ARTERY:  Patent with normal antegrade flow.

LEFT CAROTID ARTERY: No significant atherosclerotic plaque or
evidence of stenosis.

LEFT VERTEBRAL ARTERY:  Patent with normal antegrade flow.
IMPRESSION: Negative bilateral carotid duplex ultrasound. No significant
atherosclerotic plaque or evidence of stenosis.

Vertebral arteries are patent with antegrade flow.

## 2018-08-03 ENCOUNTER — Ambulatory Visit (INDEPENDENT_AMBULATORY_CARE_PROVIDER_SITE_OTHER): Payer: Medicare Other | Admitting: *Deleted

## 2018-08-03 DIAGNOSIS — I639 Cerebral infarction, unspecified: Secondary | ICD-10-CM

## 2018-08-03 NOTE — Progress Notes (Signed)
Carelink Summary Report / Loop Recorder 

## 2018-08-11 ENCOUNTER — Ambulatory Visit: Payer: Medicare Other | Admitting: Podiatry

## 2018-08-11 ENCOUNTER — Encounter: Payer: Self-pay | Admitting: Podiatry

## 2018-08-11 DIAGNOSIS — M79609 Pain in unspecified limb: Secondary | ICD-10-CM

## 2018-08-11 DIAGNOSIS — L600 Ingrowing nail: Secondary | ICD-10-CM

## 2018-08-11 DIAGNOSIS — B351 Tinea unguium: Secondary | ICD-10-CM | POA: Diagnosis not present

## 2018-08-11 NOTE — Progress Notes (Signed)
Patient ID: Nancy [name], female   DOB: 03/15/1925, 84 y.o.   MRN: [id number] Complaint:  Visit Type: Patient returns to my office for continued preventative foot care services. Complaint: Patient states" my nails have grown long and thick and become painful to walk and wear shoes" . The patient presents for preventative foot care services. No changes to ROS.  Past history of nail surgery medial border right foot. She says she has CVA in July but has recovered well.  Podiatric Exam: Vascular: dorsalis pedis and posterior tibial pulses are palpable bilateral. Capillary return is immediate. Temperature gradient is WNL. Skin turgor WNL  Sensorium: Normal Semmes Weinstein monofilament test. Normal tactile sensation bilaterally. Nail Exam: Pt has thick disfigured discolored nails with subungual debris noted bilateral entire nail hallux  Ulcer Exam: There is no evidence of ulcer or pre-ulcerative changes or infection. Orthopedic Exam: Muscle tone and strength are WNL. No limitations in general ROM. No crepitus or effusions noted. Foot type and digits show no abnormalities. Bony prominences are unremarkable. Skin: No Porokeratosis. No infection or ulcers  Diagnosis:  Onychomycosis, , Pain in right toe, pain in left toes  Treatment & Plan Procedures and Treatment: Consent by patient was obtained for treatment procedures. The patient understood the discussion of treatment and procedures well. All questions were answered thoroughly reviewed. Debridement of mycotic and hypertrophic toenails, 1 through 5 bilateral and clearing of subungual debris. No ulceration, no infection noted.  Return Visit-Office Procedure: Patient instructed to return to the office for a follow up visit 4 months for continued evaluation and treatment....   Lillias Difrancesco DPM 

## 2018-08-25 LAB — CUP PACEART REMOTE DEVICE CHECK
MDC IDC PG IMPLANT DT: 20170815
MDC IDC SESS DTM: 20191029143925

## 2018-09-06 ENCOUNTER — Ambulatory Visit (INDEPENDENT_AMBULATORY_CARE_PROVIDER_SITE_OTHER): Payer: Medicare Other

## 2018-09-06 DIAGNOSIS — I639 Cerebral infarction, unspecified: Secondary | ICD-10-CM

## 2018-09-06 NOTE — Progress Notes (Signed)
Carelink Summary Report / Loop Recorder 

## 2018-10-03 LAB — CUP PACEART REMOTE DEVICE CHECK
MDC IDC PG IMPLANT DT: 20170815
MDC IDC SESS DTM: 20191201153803

## 2018-10-08 ENCOUNTER — Ambulatory Visit (INDEPENDENT_AMBULATORY_CARE_PROVIDER_SITE_OTHER): Payer: Medicare Other

## 2018-10-08 DIAGNOSIS — R55 Syncope and collapse: Secondary | ICD-10-CM

## 2018-10-08 DIAGNOSIS — I639 Cerebral infarction, unspecified: Secondary | ICD-10-CM

## 2018-10-08 LAB — CUP PACEART REMOTE DEVICE CHECK
Implantable Pulse Generator Implant Date: 20170815
MDC IDC SESS DTM: 20200103130755

## 2018-10-11 NOTE — Progress Notes (Signed)
Carelink Summary Report / Loop Recorder 

## 2018-11-10 ENCOUNTER — Ambulatory Visit (INDEPENDENT_AMBULATORY_CARE_PROVIDER_SITE_OTHER): Payer: Medicare Other

## 2018-11-10 DIAGNOSIS — I639 Cerebral infarction, unspecified: Secondary | ICD-10-CM | POA: Diagnosis not present

## 2018-11-12 LAB — CUP PACEART REMOTE DEVICE CHECK
Implantable Pulse Generator Implant Date: 20170815
MDC IDC SESS DTM: 20200205164004

## 2018-11-19 NOTE — Progress Notes (Signed)
Carelink Summary Report / Loop Recorder 

## 2018-12-08 ENCOUNTER — Ambulatory Visit: Payer: Medicare Other | Admitting: Podiatry

## 2018-12-08 ENCOUNTER — Encounter: Payer: Self-pay | Admitting: Podiatry

## 2018-12-08 DIAGNOSIS — M79609 Pain in unspecified limb: Secondary | ICD-10-CM | POA: Diagnosis not present

## 2018-12-08 DIAGNOSIS — L600 Ingrowing nail: Secondary | ICD-10-CM | POA: Diagnosis not present

## 2018-12-08 DIAGNOSIS — B351 Tinea unguium: Secondary | ICD-10-CM

## 2018-12-08 NOTE — Progress Notes (Signed)
Patient ID: Nancy [name], female   DOB: [birth year], 85 y.o.   MRN: [id number] Complaint:  Visit Type: Patient returns to my office for continued preventative foot care services. Complaint: Patient states" my nails have grown long and thick and become painful to walk and wear shoes" . The patient presents for preventative foot care services. No changes to ROS.  Past history of nail surgery medial border right foot. She says she has CVA in July but has recovered well.  Podiatric Exam: Vascular: dorsalis pedis and posterior tibial pulses are palpable bilateral. Capillary return is immediate. Temperature gradient is WNL. Skin turgor WNL  Sensorium: Normal Semmes Weinstein monofilament test. Normal tactile sensation bilaterally. Nail Exam: Pt has thick disfigured discolored nails with subungual debris noted bilateral entire nail hallux  Ulcer Exam: There is no evidence of ulcer or pre-ulcerative changes or infection. Orthopedic Exam: Muscle tone and strength are WNL. No limitations in general ROM. No crepitus or effusions noted. Foot type and digits show no abnormalities. Bony prominences are unremarkable. Skin: No Porokeratosis. No infection or ulcers  Diagnosis:  Onychomycosis, , Pain in right toe, pain in left toes  Treatment & Plan Procedures and Treatment: Consent by patient was obtained for treatment procedures. The patient understood the discussion of treatment and procedures well. All questions were answered thoroughly reviewed. Debridement of mycotic and hypertrophic toenails, 1 through 5 bilateral and clearing of subungual debris. No ulceration, no infection noted.  Return Visit-Office Procedure: Patient instructed to return to the office for a follow up visit 4 months for continued evaluation and treatment....   Helane Gunther DPM

## 2018-12-13 ENCOUNTER — Ambulatory Visit (INDEPENDENT_AMBULATORY_CARE_PROVIDER_SITE_OTHER): Payer: Medicare Other | Admitting: *Deleted

## 2018-12-13 DIAGNOSIS — I639 Cerebral infarction, unspecified: Secondary | ICD-10-CM

## 2018-12-14 LAB — CUP PACEART REMOTE DEVICE CHECK
Implantable Pulse Generator Implant Date: 20170815
MDC IDC SESS DTM: 20200309173951

## 2018-12-20 NOTE — Progress Notes (Signed)
Carelink Summary Report / Loop Recorder 

## 2019-01-17 ENCOUNTER — Ambulatory Visit (INDEPENDENT_AMBULATORY_CARE_PROVIDER_SITE_OTHER): Payer: Medicare Other | Admitting: *Deleted

## 2019-01-17 ENCOUNTER — Other Ambulatory Visit: Payer: Self-pay

## 2019-01-17 DIAGNOSIS — R55 Syncope and collapse: Secondary | ICD-10-CM | POA: Diagnosis not present

## 2019-01-17 DIAGNOSIS — I639 Cerebral infarction, unspecified: Secondary | ICD-10-CM

## 2019-01-17 LAB — CUP PACEART REMOTE DEVICE CHECK
Date Time Interrogation Session: 20200411180956
Implantable Pulse Generator Implant Date: 20170815

## 2019-01-27 NOTE — Progress Notes (Signed)
Carelink Summary Report / Loop Recorder 

## 2019-02-17 ENCOUNTER — Ambulatory Visit (INDEPENDENT_AMBULATORY_CARE_PROVIDER_SITE_OTHER): Payer: Medicare Other | Admitting: *Deleted

## 2019-02-17 ENCOUNTER — Other Ambulatory Visit: Payer: Self-pay

## 2019-02-17 DIAGNOSIS — I639 Cerebral infarction, unspecified: Secondary | ICD-10-CM | POA: Diagnosis not present

## 2019-02-17 LAB — CUP PACEART REMOTE DEVICE CHECK
Date Time Interrogation Session: 20200514204149
Implantable Pulse Generator Implant Date: 20170815

## 2019-02-23 NOTE — Progress Notes (Signed)
Carelink Summary Report / Loop Recorder 

## 2019-03-22 ENCOUNTER — Ambulatory Visit (INDEPENDENT_AMBULATORY_CARE_PROVIDER_SITE_OTHER): Payer: Medicare Other | Admitting: *Deleted

## 2019-03-22 DIAGNOSIS — I639 Cerebral infarction, unspecified: Secondary | ICD-10-CM

## 2019-03-23 LAB — CUP PACEART REMOTE DEVICE CHECK
Date Time Interrogation Session: 20200616213942
Implantable Pulse Generator Implant Date: 20170815

## 2019-04-01 NOTE — Progress Notes (Signed)
Carelink Summary Report / Loop Recorder 

## 2019-04-12 ENCOUNTER — Ambulatory Visit: Payer: Medicare Other | Admitting: Podiatry

## 2019-04-12 ENCOUNTER — Encounter: Payer: Self-pay | Admitting: Podiatry

## 2019-04-12 ENCOUNTER — Other Ambulatory Visit: Payer: Self-pay

## 2019-04-12 DIAGNOSIS — B351 Tinea unguium: Secondary | ICD-10-CM | POA: Insufficient documentation

## 2019-04-12 DIAGNOSIS — M79674 Pain in right toe(s): Secondary | ICD-10-CM

## 2019-04-12 DIAGNOSIS — M79675 Pain in left toe(s): Secondary | ICD-10-CM | POA: Diagnosis not present

## 2019-04-12 NOTE — Progress Notes (Signed)
Patient ID: Nancy [name], female   DOB: 05/30/1925, 85 y.o.   MRN: [id number] Complaint:  Visit Type: Patient returns to my office for continued preventative foot care services. Complaint: Patient states" my nails have grown long and thick and become painful to walk and wear shoes" . The patient presents for preventative foot care services. No changes to ROS.  Past history of nail surgery medial border right foot. She says she has CVA in July but has recovered well.  Podiatric Exam: Vascular: dorsalis pedis and posterior tibial pulses are palpable bilateral. Capillary return is immediate. Cold feet.. Skin turgor WNL Purplish discoloration feet  B/L. Sensorium: Normal Semmes Weinstein monofilament test. Normal tactile sensation bilaterally. Nail Exam: Pt has thick disfigured discolored nails with subungual debris noted bilateral entire nail hallux  Ulcer Exam: There is no evidence of ulcer or pre-ulcerative changes or infection. Orthopedic Exam: Muscle tone and strength are WNL. No limitations in general ROM. No crepitus or effusions noted. Foot type and digits show no abnormalities. Bony prominences are unremarkable. Skin: No Porokeratosis. No infection or ulcers  Diagnosis:  Onychomycosis, , Pain in right toe, pain in left toes  Treatment & Plan Procedures and Treatment: Consent by patient was obtained for treatment procedures. The patient understood the discussion of treatment and procedures well. All questions were answered thoroughly reviewed. Debridement of mycotic and hypertrophic toenails, 1 through 5 bilateral and clearing of subungual debris. No ulceration, no infection noted. Patient has pain along medial border right hallux.during treatment.  Return Visit-Office Procedure: Patient instructed to return to the office for a follow up visit 4 months for continued evaluation and treatment....   Gardiner Barefoot DPM

## 2019-04-25 ENCOUNTER — Ambulatory Visit (INDEPENDENT_AMBULATORY_CARE_PROVIDER_SITE_OTHER): Payer: Medicare Other | Admitting: *Deleted

## 2019-04-25 DIAGNOSIS — I639 Cerebral infarction, unspecified: Secondary | ICD-10-CM

## 2019-04-25 LAB — CUP PACEART REMOTE DEVICE CHECK
Date Time Interrogation Session: 20200719221427
Implantable Pulse Generator Implant Date: 20170815

## 2019-05-09 NOTE — Progress Notes (Signed)
Carelink Summary Report / Loop Recorder 

## 2019-05-27 ENCOUNTER — Ambulatory Visit (INDEPENDENT_AMBULATORY_CARE_PROVIDER_SITE_OTHER): Payer: Medicare Other | Admitting: *Deleted

## 2019-05-27 DIAGNOSIS — I639 Cerebral infarction, unspecified: Secondary | ICD-10-CM

## 2019-05-29 LAB — CUP PACEART REMOTE DEVICE CHECK
Date Time Interrogation Session: 20200821221124
Implantable Pulse Generator Implant Date: 20170815

## 2019-06-03 NOTE — Progress Notes (Signed)
Carelink Summary Report / Loop Recorder 

## 2019-06-29 ENCOUNTER — Ambulatory Visit (INDEPENDENT_AMBULATORY_CARE_PROVIDER_SITE_OTHER): Payer: Medicare Other | Admitting: *Deleted

## 2019-06-29 DIAGNOSIS — I639 Cerebral infarction, unspecified: Secondary | ICD-10-CM | POA: Diagnosis not present

## 2019-06-30 LAB — CUP PACEART REMOTE DEVICE CHECK
Date Time Interrogation Session: 20200923221040
Implantable Pulse Generator Implant Date: 20170815

## 2019-07-05 NOTE — Progress Notes (Signed)
Carelink Summary Report / Loop Recorder 

## 2019-08-01 ENCOUNTER — Ambulatory Visit (INDEPENDENT_AMBULATORY_CARE_PROVIDER_SITE_OTHER): Payer: Medicare Other | Admitting: *Deleted

## 2019-08-01 DIAGNOSIS — I639 Cerebral infarction, unspecified: Secondary | ICD-10-CM

## 2019-08-02 LAB — CUP PACEART REMOTE DEVICE CHECK
Date Time Interrogation Session: 20201026220959
Implantable Pulse Generator Implant Date: 20170815

## 2019-08-16 ENCOUNTER — Encounter: Payer: Self-pay | Admitting: Podiatry

## 2019-08-16 ENCOUNTER — Ambulatory Visit: Payer: Medicare Other | Admitting: Podiatry

## 2019-08-16 ENCOUNTER — Other Ambulatory Visit: Payer: Self-pay

## 2019-08-16 DIAGNOSIS — B351 Tinea unguium: Secondary | ICD-10-CM | POA: Diagnosis not present

## 2019-08-16 DIAGNOSIS — M79674 Pain in right toe(s): Secondary | ICD-10-CM

## 2019-08-16 DIAGNOSIS — M79675 Pain in left toe(s): Secondary | ICD-10-CM | POA: Diagnosis not present

## 2019-08-16 NOTE — Progress Notes (Signed)
Patient ID: Nancy [name], female   DOB: [birth year], 85 y.o.   MRN: [id number] Complaint:  Visit Type: Patient returns to my office for continued preventative foot care services. Complaint: Patient states" my nails have grown long and thick and become painful to walk and wear shoes" . The patient presents for preventative foot care services. No changes to ROS.  Past history of nail surgery medial border right foot. She says she has CVA in July but has recovered well.  Podiatric Exam: Vascular: dorsalis pedis and posterior tibial pulses are palpable bilateral. Capillary return is immediate. Cold feet.. Skin turgor WNL Purplish discoloration feet  B/L. Sensorium: Normal Semmes Weinstein monofilament test. Normal tactile sensation bilaterally. Nail Exam: Pt has thick disfigured discolored nails with subungual debris noted bilateral entire nail hallux  Ulcer Exam: There is no evidence of ulcer or pre-ulcerative changes or infection. Orthopedic Exam: Muscle tone and strength are WNL. No limitations in general ROM. No crepitus or effusions noted. Foot type and digits show no abnormalities. Bony prominences are unremarkable. Skin: No Porokeratosis. No infection or ulcers  Diagnosis:  Onychomycosis, , Pain in right toe, pain in left toes  Treatment & Plan Procedures and Treatment: Consent by patient was obtained for treatment procedures. The patient understood the discussion of treatment and procedures well. All questions were answered thoroughly reviewed. Debridement of mycotic and hypertrophic toenails, 1 through 5 bilateral and clearing of subungual debris. No ulceration, no infection noted.  Return Visit-Office Procedure: Patient instructed to return to the office for a follow up visit 4 months for continued evaluation and treatment....   Gardiner Barefoot DPM

## 2019-08-19 NOTE — Progress Notes (Signed)
Carelink Summary Report / Loop Recorder 

## 2019-09-04 LAB — CUP PACEART REMOTE DEVICE CHECK
Date Time Interrogation Session: 20201128174243
Implantable Pulse Generator Implant Date: 20170815

## 2019-09-05 ENCOUNTER — Ambulatory Visit (INDEPENDENT_AMBULATORY_CARE_PROVIDER_SITE_OTHER): Payer: Medicare Other | Admitting: *Deleted

## 2019-09-05 DIAGNOSIS — I639 Cerebral infarction, unspecified: Secondary | ICD-10-CM | POA: Diagnosis not present

## 2019-09-28 NOTE — Progress Notes (Signed)
ILR remote 

## 2019-10-04 ENCOUNTER — Telehealth: Payer: Self-pay

## 2019-10-04 NOTE — Telephone Encounter (Signed)
Pt daughter in law states the pt has not had a follow up appointment since she had her Loop recorder put in. I told her per the transmission the pt monitor sent her battery is good. She asked how could she see the results of the pt transmissions. I told her if she had mychart after the doctor review it the results are posted on Mychart. I helped her set up a Mychart for the pt. They want an appointment with Dr. Curt Bears soon since the pt has not been seen since she got it put in. I told her I will send the scheduler a message and have her call to set up the appointment. The pt daughter in law thanked me for my help.

## 2019-10-06 ENCOUNTER — Ambulatory Visit (INDEPENDENT_AMBULATORY_CARE_PROVIDER_SITE_OTHER): Payer: Medicare Other | Admitting: *Deleted

## 2019-10-06 DIAGNOSIS — I639 Cerebral infarction, unspecified: Secondary | ICD-10-CM

## 2019-10-07 LAB — CUP PACEART REMOTE DEVICE CHECK
Date Time Interrogation Session: 20201231203407
Implantable Pulse Generator Implant Date: 20170815

## 2019-10-11 ENCOUNTER — Encounter: Payer: Medicare Other | Admitting: Cardiology

## 2019-10-29 ENCOUNTER — Ambulatory Visit: Payer: Medicare Other

## 2019-10-29 ENCOUNTER — Ambulatory Visit: Payer: Medicare PPO | Attending: Internal Medicine

## 2019-10-29 DIAGNOSIS — Z23 Encounter for immunization: Secondary | ICD-10-CM | POA: Insufficient documentation

## 2019-10-29 NOTE — Progress Notes (Signed)
   Covid-19 Vaccination Clinic  Name:  Nancy [name]    MRN: [id number] DOB: 1924-12-29  10/29/2019  Nancy [name] was observed post Covid-19 immunization for 15 minutes without incidence. She was provided with Vaccine Information Sheet and instruction to access the V-Safe system.   Nancy [name] was instructed to call 911 with any severe reactions post vaccine: Marland Kitchen Difficulty breathing  . Swelling of your face and throat  . A fast heartbeat  . A bad rash all over your body  . Dizziness and weakness    Immunizations Administered    Name Date Dose VIS Date Route   Pfizer COVID-19 Vaccine 10/29/2019  1:48 PM 0.3 mL 09/16/2019 Intramuscular   Manufacturer: [employer], [employer]   Lot: [id number]   NDC: [address]-4

## 2019-11-07 ENCOUNTER — Ambulatory Visit (INDEPENDENT_AMBULATORY_CARE_PROVIDER_SITE_OTHER): Payer: Medicare PPO | Admitting: *Deleted

## 2019-11-07 DIAGNOSIS — I639 Cerebral infarction, unspecified: Secondary | ICD-10-CM | POA: Diagnosis not present

## 2019-11-07 LAB — CUP PACEART REMOTE DEVICE CHECK
Date Time Interrogation Session: 20210131235044
Implantable Pulse Generator Implant Date: 20170815

## 2019-11-07 NOTE — Progress Notes (Signed)
ILR Remote 

## 2019-11-19 ENCOUNTER — Ambulatory Visit: Payer: Medicare PPO | Attending: Internal Medicine

## 2019-11-19 DIAGNOSIS — Z23 Encounter for immunization: Secondary | ICD-10-CM

## 2019-11-19 NOTE — Progress Notes (Signed)
   Covid-19 Vaccination Clinic  Name:  Nancy [name]    MRN: [id number] DOB: November 20, 1924  11/19/2019  Nancy [name] was observed post Covid-19 immunization for 15 minutes without incidence. She was provided with Vaccine Information Sheet and instruction to access the V-Safe system.   Nancy [name] was instructed to call 911 with any severe reactions post vaccine: Marland Kitchen Difficulty breathing  . Swelling of your face and throat  . A fast heartbeat  . A bad rash all over your body  . Dizziness and weakness    Immunizations Administered    Name Date Dose VIS Date Route   Pfizer COVID-19 Vaccine 11/19/2019 12:42 PM 0.3 mL 09/16/2019 Intramuscular   Manufacturer: [employer], [employer]   Lot: [id number]   NDC: [address]-9

## 2019-12-08 ENCOUNTER — Ambulatory Visit (INDEPENDENT_AMBULATORY_CARE_PROVIDER_SITE_OTHER): Payer: Medicare PPO | Admitting: *Deleted

## 2019-12-08 DIAGNOSIS — I639 Cerebral infarction, unspecified: Secondary | ICD-10-CM

## 2019-12-08 LAB — CUP PACEART REMOTE DEVICE CHECK
Date Time Interrogation Session: 20210304021601
Implantable Pulse Generator Implant Date: 20170815

## 2019-12-08 NOTE — Progress Notes (Signed)
ILR Remote 

## 2019-12-13 ENCOUNTER — Other Ambulatory Visit: Payer: Self-pay

## 2019-12-13 ENCOUNTER — Encounter: Payer: Self-pay | Admitting: Podiatry

## 2019-12-13 ENCOUNTER — Ambulatory Visit: Payer: Medicare PPO | Admitting: Podiatry

## 2019-12-13 VITALS — Temp 97.4°F

## 2019-12-13 DIAGNOSIS — M79675 Pain in left toe(s): Secondary | ICD-10-CM | POA: Diagnosis not present

## 2019-12-13 DIAGNOSIS — M79674 Pain in right toe(s): Secondary | ICD-10-CM | POA: Diagnosis not present

## 2019-12-13 DIAGNOSIS — L600 Ingrowing nail: Secondary | ICD-10-CM | POA: Diagnosis not present

## 2019-12-13 DIAGNOSIS — B351 Tinea unguium: Secondary | ICD-10-CM

## 2019-12-13 NOTE — Progress Notes (Signed)
Patient ID: Nancy [name], female   DOB: [birth year], 86 y.o.   MRN: [id number] Complaint:  Visit Type: Patient returns to my office for continued nail services. services. Complaint: Patient states" my nails have grown long and thick and become painful to walk and wear shoes" . Marland Kitchen No changes to ROS.  Past history of nail surgery medial border right foot. She presents to the office with her son.  She describes having jerky feet when sleeping.  Podiatric Exam: Vascular: dorsalis pedis and posterior tibial pulses are palpable bilateral. Capillary return is immediate. Cold feet.. Skin turgor WNL Purplish discoloration feet  B/L. Sensorium: Normal Semmes Weinstein monofilament test. Normal tactile sensation bilaterally. Nail Exam: Pt has thick disfigured discolored nails with subungual debris noted bilateral entire nail hallux  Ulcer Exam: There is no evidence of ulcer or pre-ulcerative changes or infection. Orthopedic Exam: Muscle tone and strength are WNL. No limitations in general ROM. No crepitus or effusions noted. Foot type and digits show no abnormalities. Bony prominences are unremarkable. 1st MCJ left foot. Skin: No Porokeratosis. No infection or ulcers  Diagnosis:  Onychomycosis, , Pain in right toe, pain in left toes  Treatment & Plan Procedures and Treatment: Consent by patient was obtained for treatment procedures. The patient understood the discussion of treatment and procedures well. All questions were answered thoroughly reviewed. Debridement of mycotic and hypertrophic toenails, 1 through 5 bilateral and clearing of subungual debris. No ulceration, no infection noted.  Return Visit-Office Procedure: Patient instructed to return to the office for a follow up visit 4 months for continued evaluation and treatment.Marland KitchenMarland KitchenTold this patient of the importance of periodic foot care to help reduce potential foot complications.   Helane Gunther DPM.

## 2019-12-14 DIAGNOSIS — Z79899 Other long term (current) drug therapy: Secondary | ICD-10-CM | POA: Diagnosis not present

## 2019-12-14 DIAGNOSIS — G2581 Restless legs syndrome: Secondary | ICD-10-CM | POA: Diagnosis not present

## 2019-12-28 DIAGNOSIS — H401133 Primary open-angle glaucoma, bilateral, severe stage: Secondary | ICD-10-CM | POA: Diagnosis not present

## 2020-01-09 ENCOUNTER — Ambulatory Visit (INDEPENDENT_AMBULATORY_CARE_PROVIDER_SITE_OTHER): Payer: Medicare PPO | Admitting: *Deleted

## 2020-01-09 DIAGNOSIS — I639 Cerebral infarction, unspecified: Secondary | ICD-10-CM | POA: Diagnosis not present

## 2020-01-09 LAB — CUP PACEART REMOTE DEVICE CHECK
Date Time Interrogation Session: 20210404031800
Implantable Pulse Generator Implant Date: 20170815

## 2020-01-10 NOTE — Progress Notes (Signed)
ILR Remote 

## 2020-01-24 DIAGNOSIS — G2581 Restless legs syndrome: Secondary | ICD-10-CM | POA: Diagnosis not present

## 2020-01-24 DIAGNOSIS — I10 Essential (primary) hypertension: Secondary | ICD-10-CM | POA: Diagnosis not present

## 2020-02-09 LAB — CUP PACEART REMOTE DEVICE CHECK
Date Time Interrogation Session: 20210505032229
Implantable Pulse Generator Implant Date: 20170815

## 2020-02-13 ENCOUNTER — Telehealth: Payer: Self-pay

## 2020-02-13 NOTE — Telephone Encounter (Signed)
[facility] alert received that LINQ has reached RRT. Spoke to [employer] ([facility], son), Patient advised she will no longer be monitored by [facility]. Patient offered options to leave device in or have device explanted. Patient consulted with family and chose to leave device in. Advised patient to call DC if any further questions shall arise, verbalize understanding.

## 2020-04-13 ENCOUNTER — Other Ambulatory Visit: Payer: Self-pay

## 2020-04-13 ENCOUNTER — Encounter: Payer: Self-pay | Admitting: Podiatry

## 2020-04-13 ENCOUNTER — Ambulatory Visit: Payer: Medicare PPO | Admitting: Podiatry

## 2020-04-13 DIAGNOSIS — M79675 Pain in left toe(s): Secondary | ICD-10-CM

## 2020-04-13 DIAGNOSIS — B351 Tinea unguium: Secondary | ICD-10-CM | POA: Diagnosis not present

## 2020-04-13 DIAGNOSIS — M79674 Pain in right toe(s): Secondary | ICD-10-CM

## 2020-04-13 NOTE — Progress Notes (Signed)
This patient returns to the office for evaluation and treatment of long thick painful nails .  This patient is unable to trim her own nails since the patient cannot reach her feet.  Patient says the nails are painful walking and wearing her shoes.  She  returns for preventive foot care services. She presents to the office with her son.  General Appearance  Alert, conversant and in no acute stress.  Vascular  Dorsalis pedis and posterior tibial  pulses are palpable  bilaterally.  Capillary return is within normal limits  bilaterally. Temperature is within normal limits  Bilaterally.  Purplish foot discoloration.  Neurologic  Senn-Weinstein monofilament wire test within normal limits  bilaterally. Muscle power within normal limits bilaterally.  Nails Thick disfigured discolored nails with subungual debris  Hallux nails  bilaterally. No evidence of bacterial infection or drainage bilaterally.  Orthopedic  No limitations of motion  feet .  No crepitus or effusions noted.  No bony pathology or digital deformities noted.  Skin  normotropic skin with no porokeratosis noted bilaterally.  No signs of infections or ulcers noted.     Onychomycosis  Pain in toes right foot  Pain in toes left foot  Debridement  of nails  1-5  B/L with a nail nipper.  Nails were then filed using a dremel tool with no incidents.    RTC  4 months   Helane Gunther DPM

## 2020-05-25 DIAGNOSIS — Z79899 Other long term (current) drug therapy: Secondary | ICD-10-CM | POA: Diagnosis not present

## 2020-05-25 DIAGNOSIS — E785 Hyperlipidemia, unspecified: Secondary | ICD-10-CM | POA: Diagnosis not present

## 2020-05-25 DIAGNOSIS — G2581 Restless legs syndrome: Secondary | ICD-10-CM | POA: Diagnosis not present

## 2020-05-25 DIAGNOSIS — Z823 Family history of stroke: Secondary | ICD-10-CM | POA: Diagnosis not present

## 2020-05-25 DIAGNOSIS — E039 Hypothyroidism, unspecified: Secondary | ICD-10-CM | POA: Diagnosis not present

## 2020-05-25 DIAGNOSIS — Z01812 Encounter for preprocedural laboratory examination: Secondary | ICD-10-CM | POA: Diagnosis not present

## 2020-05-25 DIAGNOSIS — I10 Essential (primary) hypertension: Secondary | ICD-10-CM | POA: Diagnosis not present

## 2020-05-30 DIAGNOSIS — H401133 Primary open-angle glaucoma, bilateral, severe stage: Secondary | ICD-10-CM | POA: Diagnosis not present

## 2020-06-07 DIAGNOSIS — H6123 Impacted cerumen, bilateral: Secondary | ICD-10-CM | POA: Insufficient documentation

## 2020-06-07 DIAGNOSIS — H903 Sensorineural hearing loss, bilateral: Secondary | ICD-10-CM | POA: Diagnosis not present

## 2020-06-07 DIAGNOSIS — H938X3 Other specified disorders of ear, bilateral: Secondary | ICD-10-CM | POA: Diagnosis not present

## 2020-06-07 DIAGNOSIS — J343 Hypertrophy of nasal turbinates: Secondary | ICD-10-CM | POA: Diagnosis not present

## 2020-06-07 DIAGNOSIS — L299 Pruritus, unspecified: Secondary | ICD-10-CM | POA: Diagnosis not present

## 2020-06-25 DIAGNOSIS — H903 Sensorineural hearing loss, bilateral: Secondary | ICD-10-CM | POA: Diagnosis not present

## 2020-08-14 ENCOUNTER — Ambulatory Visit: Payer: Medicare PPO | Admitting: Podiatry

## 2020-08-14 ENCOUNTER — Encounter: Payer: Self-pay | Admitting: Podiatry

## 2020-08-14 ENCOUNTER — Other Ambulatory Visit: Payer: Self-pay

## 2020-08-14 DIAGNOSIS — M79674 Pain in right toe(s): Secondary | ICD-10-CM

## 2020-08-14 DIAGNOSIS — M79675 Pain in left toe(s): Secondary | ICD-10-CM | POA: Diagnosis not present

## 2020-08-14 DIAGNOSIS — B351 Tinea unguium: Secondary | ICD-10-CM

## 2020-08-14 NOTE — Progress Notes (Signed)
This patient returns to the office for evaluation and treatment of long thick painful nails .  This patient is unable to trim her own nails since the patient cannot reach her feet.  Patient says the nails are painful walking and wearing her shoes.  She  returns for preventive foot care services. She presents to the office with her son.  General Appearance  Alert, conversant and in no acute stress.  Vascular  Dorsalis pedis and posterior tibial  pulses are palpable  bilaterally.  Capillary return is within normal limits  bilaterally. Temperature is within normal limits  Bilaterally.  Purplish foot discoloration.  Neurologic  Senn-Weinstein monofilament wire test within normal limits  bilaterally. Muscle power within normal limits bilaterally.  Nails Thick disfigured discolored nails with subungual debris  Hallux nails  bilaterally. No evidence of bacterial infection or drainage bilaterally.  Orthopedic  No limitations of motion  feet .  No crepitus or effusions noted.  No bony pathology or digital deformities noted.  Skin  normotropic skin with no porokeratosis noted bilaterally.  No signs of infections or ulcers noted.     Onychomycosis  Pain in toes right foot  Pain in toes left foot  Debridement  of nails  1-5  B/L with a nail nipper.  Nails were then filed using a dremel tool with no incidents.    RTC  4 months   Helane Gunther DPM

## 2020-09-05 DIAGNOSIS — Z20822 Contact with and (suspected) exposure to covid-19: Secondary | ICD-10-CM | POA: Diagnosis not present

## 2020-09-25 DIAGNOSIS — E039 Hypothyroidism, unspecified: Secondary | ICD-10-CM | POA: Diagnosis not present

## 2020-09-25 DIAGNOSIS — I10 Essential (primary) hypertension: Secondary | ICD-10-CM | POA: Diagnosis not present

## 2020-09-25 DIAGNOSIS — E785 Hyperlipidemia, unspecified: Secondary | ICD-10-CM | POA: Diagnosis not present

## 2020-10-31 DIAGNOSIS — H401133 Primary open-angle glaucoma, bilateral, severe stage: Secondary | ICD-10-CM | POA: Diagnosis not present

## 2020-12-06 DIAGNOSIS — M7062 Trochanteric bursitis, left hip: Secondary | ICD-10-CM | POA: Diagnosis not present

## 2020-12-12 ENCOUNTER — Other Ambulatory Visit: Payer: Self-pay

## 2020-12-12 ENCOUNTER — Ambulatory Visit: Payer: Medicare PPO | Admitting: Podiatry

## 2020-12-12 ENCOUNTER — Encounter: Payer: Self-pay | Admitting: Podiatry

## 2020-12-12 DIAGNOSIS — L600 Ingrowing nail: Secondary | ICD-10-CM

## 2020-12-12 DIAGNOSIS — M79674 Pain in right toe(s): Secondary | ICD-10-CM

## 2020-12-12 DIAGNOSIS — B351 Tinea unguium: Secondary | ICD-10-CM

## 2020-12-12 DIAGNOSIS — M79675 Pain in left toe(s): Secondary | ICD-10-CM | POA: Diagnosis not present

## 2020-12-12 NOTE — Progress Notes (Signed)
This patient returns to the office for evaluation and treatment of long thick painful nails .  This patient is unable to trim her own nails since the patient cannot reach her feet.  Patient says the nails are painful walking and wearing her shoes.  She  returns for preventive foot care services. She presents to the office with her son.  General Appearance  Alert, conversant and in no acute stress.  Vascular  Dorsalis pedis and posterior tibial  pulses are weakly  palpable  bilaterally.  Capillary return is within normal limits  bilaterally. Cold feet. Bilaterally.  Purplish foot discoloration.  Absent digital hair  B/L.  Neurologic  Senn-Weinstein monofilament wire test within normal limits  bilaterally. Muscle power within normal limits bilaterally.  Nails Thick disfigured discolored nails with subungual debris  Hallux nails  bilaterally. No evidence of bacterial infection or drainage bilaterally.    Orthopedic  No limitations of motion  feet .  No crepitus or effusions noted.  No bony pathology or digital deformities noted.  Skin  normotropic skin with no porokeratosis noted bilaterally.  No signs of infections or ulcers noted.     Onychomycosis  Pain in toes right foot  Pain in toes left foot  Debridement  of nails  1-5  B/L with a nail nipper.  Nails were then filed using a dremel tool with no incidents.    RTC  4   months   Linc Renne DPM  

## 2020-12-18 DIAGNOSIS — I639 Cerebral infarction, unspecified: Secondary | ICD-10-CM | POA: Diagnosis not present

## 2020-12-18 DIAGNOSIS — Z79899 Other long term (current) drug therapy: Secondary | ICD-10-CM | POA: Diagnosis not present

## 2020-12-18 DIAGNOSIS — E785 Hyperlipidemia, unspecified: Secondary | ICD-10-CM | POA: Diagnosis not present

## 2020-12-18 DIAGNOSIS — I10 Essential (primary) hypertension: Secondary | ICD-10-CM | POA: Diagnosis not present

## 2020-12-18 DIAGNOSIS — E039 Hypothyroidism, unspecified: Secondary | ICD-10-CM | POA: Diagnosis not present

## 2020-12-24 DIAGNOSIS — H401133 Primary open-angle glaucoma, bilateral, severe stage: Secondary | ICD-10-CM | POA: Diagnosis not present

## 2020-12-25 DIAGNOSIS — Z8673 Personal history of transient ischemic attack (TIA), and cerebral infarction without residual deficits: Secondary | ICD-10-CM | POA: Diagnosis not present

## 2020-12-25 DIAGNOSIS — I10 Essential (primary) hypertension: Secondary | ICD-10-CM | POA: Diagnosis not present

## 2021-01-17 DIAGNOSIS — X32XXXA Exposure to sunlight, initial encounter: Secondary | ICD-10-CM | POA: Diagnosis not present

## 2021-01-17 DIAGNOSIS — Z1283 Encounter for screening for malignant neoplasm of skin: Secondary | ICD-10-CM | POA: Diagnosis not present

## 2021-01-17 DIAGNOSIS — D225 Melanocytic nevi of trunk: Secondary | ICD-10-CM | POA: Diagnosis not present

## 2021-01-17 DIAGNOSIS — L570 Actinic keratosis: Secondary | ICD-10-CM | POA: Diagnosis not present

## 2021-04-17 ENCOUNTER — Encounter: Payer: Self-pay | Admitting: Podiatry

## 2021-04-17 ENCOUNTER — Ambulatory Visit: Payer: Medicare PPO | Admitting: Podiatry

## 2021-04-17 ENCOUNTER — Other Ambulatory Visit: Payer: Self-pay

## 2021-04-17 DIAGNOSIS — M79675 Pain in left toe(s): Secondary | ICD-10-CM

## 2021-04-17 DIAGNOSIS — B351 Tinea unguium: Secondary | ICD-10-CM | POA: Diagnosis not present

## 2021-04-17 DIAGNOSIS — M79674 Pain in right toe(s): Secondary | ICD-10-CM

## 2021-04-17 NOTE — Progress Notes (Signed)
This patient returns to the office for evaluation and treatment of long thick painful nails .  This patient is unable to trim her own nails since the patient cannot reach her feet.  Patient says the nails are painful walking and wearing her shoes.  She  returns for preventive foot care services. She presents to the office with her son.  General Appearance  Alert, conversant and in no acute stress.  Vascular  Dorsalis pedis and posterior tibial  pulses are weakly  palpable  bilaterally.  Capillary return is within normal limits  bilaterally. Cold feet. Bilaterally.  Purplish foot discoloration.  Absent digital hair  B/L.  Neurologic  Senn-Weinstein monofilament wire test within normal limits  bilaterally. Muscle power within normal limits bilaterally.  Nails Thick disfigured discolored nails with subungual debris  Hallux nails  bilaterally. No evidence of bacterial infection or drainage bilaterally.    Orthopedic  No limitations of motion  feet .  No crepitus or effusions noted.  No bony pathology or digital deformities noted.  Skin  normotropic skin with no porokeratosis noted bilaterally.  No signs of infections or ulcers noted.     Onychomycosis  Pain in toes right foot  Pain in toes left foot  Debridement  of nails  1-5  B/L with a nail nipper.  Nails were then filed using a dremel tool with no incidents.    RTC  4   months   Jaylun Fleener DPM  

## 2021-04-22 DIAGNOSIS — E785 Hyperlipidemia, unspecified: Secondary | ICD-10-CM | POA: Diagnosis not present

## 2021-04-22 DIAGNOSIS — Z79899 Other long term (current) drug therapy: Secondary | ICD-10-CM | POA: Diagnosis not present

## 2021-04-22 DIAGNOSIS — I10 Essential (primary) hypertension: Secondary | ICD-10-CM | POA: Diagnosis not present

## 2021-04-22 DIAGNOSIS — I639 Cerebral infarction, unspecified: Secondary | ICD-10-CM | POA: Diagnosis not present

## 2021-04-29 DIAGNOSIS — I10 Essential (primary) hypertension: Secondary | ICD-10-CM | POA: Diagnosis not present

## 2021-04-29 DIAGNOSIS — E039 Hypothyroidism, unspecified: Secondary | ICD-10-CM | POA: Diagnosis not present

## 2021-05-01 DIAGNOSIS — H401133 Primary open-angle glaucoma, bilateral, severe stage: Secondary | ICD-10-CM | POA: Diagnosis not present

## 2021-08-20 ENCOUNTER — Other Ambulatory Visit: Payer: Self-pay

## 2021-08-20 ENCOUNTER — Encounter: Payer: Self-pay | Admitting: Podiatry

## 2021-08-20 ENCOUNTER — Ambulatory Visit: Payer: Medicare PPO | Admitting: Podiatry

## 2021-08-20 DIAGNOSIS — M79675 Pain in left toe(s): Secondary | ICD-10-CM

## 2021-08-20 DIAGNOSIS — M79674 Pain in right toe(s): Secondary | ICD-10-CM

## 2021-08-20 DIAGNOSIS — B351 Tinea unguium: Secondary | ICD-10-CM

## 2021-08-20 NOTE — Progress Notes (Signed)
This patient returns to the office for evaluation and treatment of long thick painful nails .  This patient is unable to trim her own nails since the patient cannot reach her feet.  Patient says the nails are painful walking and wearing her shoes.  She  returns for preventive foot care services. She presents to the office with her son.  General Appearance  Alert, conversant and in no acute stress.  Vascular  Dorsalis pedis and posterior tibial  pulses are weakly  palpable  bilaterally.  Capillary return is within normal limits  bilaterally. Cold feet. Bilaterally.  Purplish foot discoloration.  Absent digital hair  B/L.  Neurologic  Senn-Weinstein monofilament wire test within normal limits  bilaterally. Muscle power within normal limits bilaterally.  Nails Thick disfigured discolored nails with subungual debris  Hallux nails  bilaterally. No evidence of bacterial infection or drainage bilaterally.    Orthopedic  No limitations of motion  feet .  No crepitus or effusions noted.  No bony pathology or digital deformities noted.  Skin  normotropic skin with no porokeratosis noted bilaterally.  No signs of infections or ulcers noted.     Onychomycosis  Pain in toes right foot  Pain in toes left foot  Debridement  of nails  1-5  B/L with a nail nipper.  Nails were then filed using a dremel tool with no incidents.    RTC  4   months   Ahmyah Gidley DPM  

## 2021-08-27 DIAGNOSIS — Z79899 Other long term (current) drug therapy: Secondary | ICD-10-CM | POA: Diagnosis not present

## 2021-08-27 DIAGNOSIS — E039 Hypothyroidism, unspecified: Secondary | ICD-10-CM | POA: Diagnosis not present

## 2021-09-03 DIAGNOSIS — I10 Essential (primary) hypertension: Secondary | ICD-10-CM | POA: Diagnosis not present

## 2021-09-03 DIAGNOSIS — E039 Hypothyroidism, unspecified: Secondary | ICD-10-CM | POA: Diagnosis not present

## 2021-09-16 DIAGNOSIS — H401133 Primary open-angle glaucoma, bilateral, severe stage: Secondary | ICD-10-CM | POA: Diagnosis not present

## 2021-12-24 ENCOUNTER — Encounter: Payer: Self-pay | Admitting: Podiatry

## 2021-12-24 ENCOUNTER — Ambulatory Visit: Payer: Medicare PPO | Admitting: Podiatry

## 2021-12-24 ENCOUNTER — Other Ambulatory Visit: Payer: Self-pay

## 2021-12-24 DIAGNOSIS — I10 Essential (primary) hypertension: Secondary | ICD-10-CM | POA: Diagnosis not present

## 2021-12-24 DIAGNOSIS — L600 Ingrowing nail: Secondary | ICD-10-CM

## 2021-12-24 DIAGNOSIS — B351 Tinea unguium: Secondary | ICD-10-CM | POA: Diagnosis not present

## 2021-12-24 DIAGNOSIS — M810 Age-related osteoporosis without current pathological fracture: Secondary | ICD-10-CM | POA: Diagnosis not present

## 2021-12-24 DIAGNOSIS — M79675 Pain in left toe(s): Secondary | ICD-10-CM | POA: Diagnosis not present

## 2021-12-24 DIAGNOSIS — H409 Unspecified glaucoma: Secondary | ICD-10-CM | POA: Diagnosis not present

## 2021-12-24 DIAGNOSIS — E785 Hyperlipidemia, unspecified: Secondary | ICD-10-CM | POA: Diagnosis not present

## 2021-12-24 DIAGNOSIS — M79674 Pain in right toe(s): Secondary | ICD-10-CM

## 2021-12-24 DIAGNOSIS — Z79899 Other long term (current) drug therapy: Secondary | ICD-10-CM | POA: Diagnosis not present

## 2021-12-24 DIAGNOSIS — E039 Hypothyroidism, unspecified: Secondary | ICD-10-CM | POA: Diagnosis not present

## 2021-12-24 NOTE — Progress Notes (Signed)
This patient returns to the office for evaluation and treatment of long thick painful nails .  This patient is unable to trim her own nails since the patient cannot reach her feet.  Patient says the nails are painful walking and wearing her shoes.  She  returns for preventive foot care services. She presents to the office with her son. ? ?General Appearance  Alert, conversant and in no acute stress. ? ?Vascular  Dorsalis pedis and posterior tibial  pulses are weakly  palpable  bilaterally.  Capillary return is within normal limits  bilaterally. Cold feet. Bilaterally.  Purplish foot discoloration.  Absent digital hair  B/L. ? ?Neurologic  Senn-Weinstein monofilament wire test within normal limits  bilaterally. Muscle power within normal limits bilaterally. ? ?Nails Thick disfigured discolored nails with subungual debris  Hallux nails  bilaterally. No evidence of bacterial infection or drainage bilaterally.  Dried pus pocket left hallux. ? ?Orthopedic  No limitations of motion  feet .  No crepitus or effusions noted.  No bony pathology or digital deformities noted. ? ?Skin  normotropic skin with no porokeratosis noted bilaterally.  No signs of infections or ulcers noted.    ? ?Onychomycosis  Pain in toes right foot  Pain in toes left foot ? ?Debridement  of nails  1-5  B/L with a nail nipper.  Nails were then filed using a dremel tool with no incidents.    RTC  3  months ? ? ?Gardiner Barefoot DPM  ?

## 2022-01-01 DIAGNOSIS — I10 Essential (primary) hypertension: Secondary | ICD-10-CM | POA: Diagnosis not present

## 2022-01-01 DIAGNOSIS — E785 Hyperlipidemia, unspecified: Secondary | ICD-10-CM | POA: Diagnosis not present

## 2022-01-01 DIAGNOSIS — Z8673 Personal history of transient ischemic attack (TIA), and cerebral infarction without residual deficits: Secondary | ICD-10-CM | POA: Diagnosis not present

## 2022-02-17 DIAGNOSIS — H401133 Primary open-angle glaucoma, bilateral, severe stage: Secondary | ICD-10-CM | POA: Diagnosis not present

## 2022-04-01 ENCOUNTER — Ambulatory Visit: Payer: Medicare PPO | Admitting: Podiatry

## 2022-04-01 DIAGNOSIS — B351 Tinea unguium: Secondary | ICD-10-CM | POA: Diagnosis not present

## 2022-04-01 DIAGNOSIS — M79674 Pain in right toe(s): Secondary | ICD-10-CM | POA: Diagnosis not present

## 2022-04-01 DIAGNOSIS — M79675 Pain in left toe(s): Secondary | ICD-10-CM | POA: Diagnosis not present

## 2022-04-01 DIAGNOSIS — L600 Ingrowing nail: Secondary | ICD-10-CM

## 2022-07-04 DIAGNOSIS — I10 Essential (primary) hypertension: Secondary | ICD-10-CM | POA: Diagnosis not present

## 2022-07-04 DIAGNOSIS — R609 Edema, unspecified: Secondary | ICD-10-CM | POA: Diagnosis not present

## 2022-07-04 DIAGNOSIS — E039 Hypothyroidism, unspecified: Secondary | ICD-10-CM | POA: Diagnosis not present

## 2022-07-04 DIAGNOSIS — Z23 Encounter for immunization: Secondary | ICD-10-CM | POA: Diagnosis not present

## 2022-07-30 ENCOUNTER — Encounter: Payer: Self-pay | Admitting: Podiatry

## 2022-07-30 ENCOUNTER — Ambulatory Visit: Payer: Medicare PPO | Admitting: Podiatry

## 2022-07-30 DIAGNOSIS — M79675 Pain in left toe(s): Secondary | ICD-10-CM | POA: Diagnosis not present

## 2022-07-30 DIAGNOSIS — M79674 Pain in right toe(s): Secondary | ICD-10-CM

## 2022-07-30 DIAGNOSIS — B351 Tinea unguium: Secondary | ICD-10-CM | POA: Diagnosis not present

## 2022-07-30 NOTE — Progress Notes (Signed)
This patient returns to the office for evaluation and treatment of long thick painful nails .  This patient is unable to trim her own nails since the patient cannot reach her feet.  Patient says the nails are painful walking and wearing her shoes.  She  returns for preventive foot care services. She presents to the office with her son.  General Appearance  Alert, conversant and in no acute stress.  Vascular  Dorsalis pedis and posterior tibial  pulses are weakly  palpable  bilaterally.  Capillary return is within normal limits  bilaterally. Cold feet. Bilaterally.  Purplish foot discoloration.  Absent digital hair  B/L.  Neurologic  Senn-Weinstein monofilament wire test within normal limits  bilaterally. Muscle power within normal limits bilaterally.  Nails Thick disfigured discolored nails with subungual debris  Hallux nails  bilaterally. No evidence of bacterial infection or drainage bilaterally.    Orthopedic  No limitations of motion  feet .  No crepitus or effusions noted.  No bony pathology or digital deformities noted.  Skin  normotropic skin with no porokeratosis noted bilaterally.  No signs of infections or ulcers noted.     Onychomycosis  Pain in toes right foot  Pain in toes left foot  Debridement  of nails  1-5  B/L with a nail nipper.  Nails were then filed using a dremel tool with no incidents.    RTC  4   months   Nancy [name] DPM  

## 2022-08-18 DIAGNOSIS — H401133 Primary open-angle glaucoma, bilateral, severe stage: Secondary | ICD-10-CM | POA: Diagnosis not present

## 2022-11-05 ENCOUNTER — Ambulatory Visit: Payer: Medicare PPO | Admitting: Podiatry

## 2022-11-17 ENCOUNTER — Encounter: Payer: Self-pay | Admitting: Podiatry

## 2022-11-17 ENCOUNTER — Ambulatory Visit: Payer: Medicare PPO | Admitting: Podiatry

## 2022-11-17 DIAGNOSIS — B351 Tinea unguium: Secondary | ICD-10-CM

## 2022-11-17 DIAGNOSIS — M79675 Pain in left toe(s): Secondary | ICD-10-CM

## 2022-11-17 DIAGNOSIS — M79674 Pain in right toe(s): Secondary | ICD-10-CM | POA: Diagnosis not present

## 2022-11-17 NOTE — Progress Notes (Signed)
This patient returns to the office for evaluation and treatment of long thick painful nails .  This patient is unable to trim her own nails since the patient cannot reach her feet.  Patient says the nails are painful walking and wearing her shoes.  She  returns for preventive foot care services. She presents to the office with her son.  General Appearance  Alert, conversant and in no acute stress.  Vascular  Dorsalis pedis and posterior tibial  pulses are weakly  palpable  bilaterally.  Capillary return is within normal limits  bilaterally. Cold feet. Bilaterally.  Purplish foot discoloration.  Absent digital hair  B/L.  Neurologic  Senn-Weinstein monofilament wire test within normal limits  bilaterally. Muscle power within normal limits bilaterally.  Nails Thick disfigured discolored nails with subungual debris  Hallux nails  bilaterally. No evidence of bacterial infection or drainage bilaterally.    Orthopedic  No limitations of motion  feet .  No crepitus or effusions noted.  No bony pathology or digital deformities noted.  Skin  normotropic skin with no porokeratosis noted bilaterally.  No signs of infections or ulcers noted.     Onychomycosis  Pain in toes right foot  Pain in toes left foot  Debridement  of nails  1-5  B/L with a nail nipper.  Nails were then filed using a dremel tool with no incidents.    RTC  4   months   Gardiner Barefoot DPM

## 2022-12-26 DIAGNOSIS — R609 Edema, unspecified: Secondary | ICD-10-CM | POA: Diagnosis not present

## 2022-12-26 DIAGNOSIS — E039 Hypothyroidism, unspecified: Secondary | ICD-10-CM | POA: Diagnosis not present

## 2022-12-26 DIAGNOSIS — I10 Essential (primary) hypertension: Secondary | ICD-10-CM | POA: Diagnosis not present

## 2023-01-02 DIAGNOSIS — I10 Essential (primary) hypertension: Secondary | ICD-10-CM | POA: Diagnosis not present

## 2023-01-02 DIAGNOSIS — E785 Hyperlipidemia, unspecified: Secondary | ICD-10-CM | POA: Diagnosis not present

## 2023-01-02 DIAGNOSIS — N1831 Chronic kidney disease, stage 3a: Secondary | ICD-10-CM | POA: Diagnosis not present

## 2023-01-19 DIAGNOSIS — H401133 Primary open-angle glaucoma, bilateral, severe stage: Secondary | ICD-10-CM | POA: Diagnosis not present

## 2023-02-05 DIAGNOSIS — Z8673 Personal history of transient ischemic attack (TIA), and cerebral infarction without residual deficits: Secondary | ICD-10-CM | POA: Diagnosis not present

## 2023-02-05 DIAGNOSIS — H401133 Primary open-angle glaucoma, bilateral, severe stage: Secondary | ICD-10-CM | POA: Diagnosis not present

## 2023-02-05 DIAGNOSIS — H34813 Central retinal vein occlusion, bilateral, with macular edema: Secondary | ICD-10-CM | POA: Diagnosis not present

## 2023-02-05 DIAGNOSIS — H353131 Nonexudative age-related macular degeneration, bilateral, early dry stage: Secondary | ICD-10-CM | POA: Diagnosis not present

## 2023-02-05 DIAGNOSIS — Z961 Presence of intraocular lens: Secondary | ICD-10-CM | POA: Diagnosis not present

## 2023-03-16 ENCOUNTER — Encounter: Payer: Self-pay | Admitting: Podiatry

## 2023-03-16 ENCOUNTER — Ambulatory Visit: Payer: Medicare PPO | Admitting: Podiatry

## 2023-03-16 DIAGNOSIS — B351 Tinea unguium: Secondary | ICD-10-CM

## 2023-03-16 DIAGNOSIS — L600 Ingrowing nail: Secondary | ICD-10-CM

## 2023-03-16 DIAGNOSIS — M79675 Pain in left toe(s): Secondary | ICD-10-CM

## 2023-03-16 DIAGNOSIS — M79674 Pain in right toe(s): Secondary | ICD-10-CM | POA: Diagnosis not present

## 2023-03-16 NOTE — Progress Notes (Signed)
This patient returns to the office for evaluation and treatment of long thick painful nails .  This patient is unable to trim her own nails since the patient cannot reach her feet.  Patient says the nails are painful walking and wearing her shoes.  She  returns for preventive foot care services. She presents to the office with her son.  General Appearance  Alert, conversant and in no acute stress.  Vascular  Dorsalis pedis and posterior tibial  pulses are weakly  palpable  bilaterally.  Capillary return is within normal limits  bilaterally. Cold feet. Bilaterally.  Purplish foot discoloration.  Absent digital hair  B/L.  Neurologic  Senn-Weinstein monofilament wire test within normal limits  bilaterally. Muscle power within normal limits bilaterally.  Nails Thick disfigured discolored nails with subungual debris  Hallux nails  bilaterally. No evidence of bacterial infection or drainage bilaterally.    Orthopedic  No limitations of motion  feet .  No crepitus or effusions noted.  No bony pathology or digital deformities noted.  Skin  normotropic skin with no porokeratosis noted bilaterally.  No signs of infections or ulcers noted.     Onychomycosis  Pain in toes right foot  Pain in toes left foot  Debridement  of nails  1-5  B/L with a nail nipper.  Nails were then filed using a dremel tool with no incidents.    RTC  4   months   Kamir Selover DPM  

## 2023-06-22 DIAGNOSIS — H401133 Primary open-angle glaucoma, bilateral, severe stage: Secondary | ICD-10-CM | POA: Diagnosis not present

## 2023-06-26 DIAGNOSIS — Z79899 Other long term (current) drug therapy: Secondary | ICD-10-CM | POA: Diagnosis not present

## 2023-06-26 DIAGNOSIS — I10 Essential (primary) hypertension: Secondary | ICD-10-CM | POA: Diagnosis not present

## 2023-06-26 DIAGNOSIS — E785 Hyperlipidemia, unspecified: Secondary | ICD-10-CM | POA: Diagnosis not present

## 2023-06-26 DIAGNOSIS — N1831 Chronic kidney disease, stage 3a: Secondary | ICD-10-CM | POA: Diagnosis not present

## 2023-06-26 DIAGNOSIS — I6300 Cerebral infarction due to thrombosis of unspecified precerebral artery: Secondary | ICD-10-CM | POA: Diagnosis not present

## 2023-07-03 DIAGNOSIS — I10 Essential (primary) hypertension: Secondary | ICD-10-CM | POA: Diagnosis not present

## 2023-07-03 DIAGNOSIS — N1831 Chronic kidney disease, stage 3a: Secondary | ICD-10-CM | POA: Diagnosis not present

## 2023-07-03 DIAGNOSIS — E039 Hypothyroidism, unspecified: Secondary | ICD-10-CM | POA: Diagnosis not present

## 2023-07-16 ENCOUNTER — Ambulatory Visit: Payer: Medicare PPO | Admitting: Podiatry

## 2023-07-16 ENCOUNTER — Encounter: Payer: Self-pay | Admitting: Podiatry

## 2023-07-16 DIAGNOSIS — M79675 Pain in left toe(s): Secondary | ICD-10-CM | POA: Diagnosis not present

## 2023-07-16 DIAGNOSIS — M79674 Pain in right toe(s): Secondary | ICD-10-CM

## 2023-07-16 DIAGNOSIS — B351 Tinea unguium: Secondary | ICD-10-CM

## 2023-07-16 NOTE — Progress Notes (Signed)
This patient returns to the office for evaluation and treatment of long thick painful nails .  This patient is unable to trim her own nails since the patient cannot reach her feet.  Patient says the nails are painful walking and wearing her shoes.  She  returns for preventive foot care services. She presents to the office with her son.  General Appearance  Alert, conversant and in no acute stress.  Vascular  Dorsalis pedis and posterior tibial  pulses are weakly  palpable  bilaterally.  Capillary return is within normal limits  bilaterally. Cold feet. Bilaterally.  Purplish foot discoloration.  Absent digital hair  B/L.  Neurologic  Senn-Weinstein monofilament wire test within normal limits  bilaterally. Muscle power within normal limits bilaterally.  Nails Thick disfigured discolored nails with subungual debris  Hallux nails  bilaterally. No evidence of bacterial infection or drainage bilaterally.    Orthopedic  No limitations of motion  feet .  No crepitus or effusions noted.  No bony pathology or digital deformities noted.  Skin  normotropic skin with no porokeratosis noted bilaterally.  No signs of infections or ulcers noted.     Onychomycosis  Pain in toes right foot  Pain in toes left foot  Debridement  of nails  1-5  B/L with a nail nipper.  Nails were then filed using a dremel tool with no incidents.    RTC  4   months   Tonna Palazzi DPM  

## 2023-10-19 DIAGNOSIS — H401133 Primary open-angle glaucoma, bilateral, severe stage: Secondary | ICD-10-CM | POA: Diagnosis not present

## 2023-11-19 ENCOUNTER — Encounter: Payer: Self-pay | Admitting: Podiatry

## 2023-11-19 ENCOUNTER — Ambulatory Visit: Payer: Medicare PPO | Admitting: Podiatry

## 2023-11-19 DIAGNOSIS — M79675 Pain in left toe(s): Secondary | ICD-10-CM | POA: Diagnosis not present

## 2023-11-19 DIAGNOSIS — M79674 Pain in right toe(s): Secondary | ICD-10-CM

## 2023-11-19 DIAGNOSIS — B351 Tinea unguium: Secondary | ICD-10-CM | POA: Diagnosis not present

## 2023-11-19 DIAGNOSIS — L600 Ingrowing nail: Secondary | ICD-10-CM

## 2023-11-19 NOTE — Progress Notes (Signed)
This patient returns to the office for evaluation and treatment of long thick painful nails .  This patient is unable to trim her own nails since the patient cannot reach her feet.  Patient says the nails are painful walking and wearing her shoes.  She  returns for preventive foot care services. She presents to the office with her son.  General Appearance  Alert, conversant and in no acute stress.  Vascular  Dorsalis pedis and posterior tibial  pulses are weakly  palpable  bilaterally.  Capillary return is within normal limits  bilaterally. Cold feet. Bilaterally.  Purplish foot discoloration.  Absent digital hair  B/L.  Neurologic  Senn-Weinstein monofilament wire test within normal limits  bilaterally. Muscle power within normal limits bilaterally.  Nails Thick disfigured discolored nails with subungual debris  Hallux nails  bilaterally. No evidence of bacterial infection or drainage bilaterally.    Orthopedic  No limitations of motion  feet .  No crepitus or effusions noted.  No bony pathology or digital deformities noted.  Skin  normotropic skin with no porokeratosis noted bilaterally.  No signs of infections or ulcers noted.     Onychomycosis  Pain in toes right foot  Pain in toes left foot  Debridement  of nails  1-5  B/L with a nail nipper.  Nails were then filed using a dremel tool with no incidents.    RTC  4   months   Helane Gunther DPM

## 2023-12-24 DIAGNOSIS — N1831 Chronic kidney disease, stage 3a: Secondary | ICD-10-CM | POA: Diagnosis not present

## 2023-12-24 DIAGNOSIS — I10 Essential (primary) hypertension: Secondary | ICD-10-CM | POA: Diagnosis not present

## 2023-12-24 DIAGNOSIS — I6300 Cerebral infarction due to thrombosis of unspecified precerebral artery: Secondary | ICD-10-CM | POA: Diagnosis not present

## 2023-12-24 DIAGNOSIS — E039 Hypothyroidism, unspecified: Secondary | ICD-10-CM | POA: Diagnosis not present

## 2023-12-24 DIAGNOSIS — Z79899 Other long term (current) drug therapy: Secondary | ICD-10-CM | POA: Diagnosis not present

## 2023-12-31 DIAGNOSIS — N1831 Chronic kidney disease, stage 3a: Secondary | ICD-10-CM | POA: Diagnosis not present

## 2023-12-31 DIAGNOSIS — Z8673 Personal history of transient ischemic attack (TIA), and cerebral infarction without residual deficits: Secondary | ICD-10-CM | POA: Diagnosis not present

## 2023-12-31 DIAGNOSIS — I10 Essential (primary) hypertension: Secondary | ICD-10-CM | POA: Diagnosis not present

## 2023-12-31 DIAGNOSIS — E039 Hypothyroidism, unspecified: Secondary | ICD-10-CM | POA: Diagnosis not present

## 2024-02-22 DIAGNOSIS — H401133 Primary open-angle glaucoma, bilateral, severe stage: Secondary | ICD-10-CM | POA: Diagnosis not present

## 2024-02-22 DIAGNOSIS — H548 Legal blindness, as defined in USA: Secondary | ICD-10-CM | POA: Diagnosis not present

## 2024-02-22 DIAGNOSIS — Z8673 Personal history of transient ischemic attack (TIA), and cerebral infarction without residual deficits: Secondary | ICD-10-CM | POA: Diagnosis not present

## 2024-02-22 DIAGNOSIS — Z7982 Long term (current) use of aspirin: Secondary | ICD-10-CM | POA: Diagnosis not present

## 2024-03-15 ENCOUNTER — Encounter: Payer: Self-pay | Admitting: Podiatry

## 2024-03-15 ENCOUNTER — Ambulatory Visit: Payer: Medicare PPO | Admitting: Podiatry

## 2024-03-15 DIAGNOSIS — M79674 Pain in right toe(s): Secondary | ICD-10-CM

## 2024-03-15 DIAGNOSIS — B351 Tinea unguium: Secondary | ICD-10-CM | POA: Diagnosis not present

## 2024-03-15 DIAGNOSIS — M79675 Pain in left toe(s): Secondary | ICD-10-CM | POA: Diagnosis not present

## 2024-03-15 NOTE — Progress Notes (Signed)
 This patient returns to the office for evaluation and treatment of long thick painful nails .  This patient is unable to trim her own nails since the patient cannot reach her feet.  Patient says the nails are painful walking and wearing her shoes.  She  returns for preventive foot care services. She presents to the office with her son.  General Appearance  Alert, conversant and in no acute stress.  Vascular  Dorsalis pedis and posterior tibial  pulses are weakly  palpable  bilaterally.  Capillary return is within normal limits  bilaterally. Cold feet. Bilaterally.  Purplish foot discoloration.  Absent digital hair  B/L.  Neurologic  Senn-Weinstein monofilament wire test within normal limits  bilaterally. Muscle power within normal limits bilaterally.  Nails Thick disfigured discolored nails with subungual debris  Hallux nails  bilaterally. No evidence of bacterial infection or drainage bilaterally.    Orthopedic  No limitations of motion  feet .  No crepitus or effusions noted.  No bony pathology or digital deformities noted.  Skin  normotropic skin with no porokeratosis noted bilaterally.  No signs of infections or ulcers noted.     Onychomycosis  Pain in toes right foot  Pain in toes left foot  Debridement  of nails  1-5  B/L with a nail nipper.  Nails were then filed using a dremel tool with no incidents.    RTC  4   months   Helane Gunther DPM

## 2024-06-15 ENCOUNTER — Ambulatory Visit: Admitting: Podiatry

## 2024-06-22 ENCOUNTER — Ambulatory Visit: Admitting: Podiatry

## 2024-06-22 ENCOUNTER — Encounter: Payer: Self-pay | Admitting: Podiatry

## 2024-06-22 DIAGNOSIS — M79674 Pain in right toe(s): Secondary | ICD-10-CM

## 2024-06-22 DIAGNOSIS — B351 Tinea unguium: Secondary | ICD-10-CM

## 2024-06-22 DIAGNOSIS — M79675 Pain in left toe(s): Secondary | ICD-10-CM

## 2024-06-22 NOTE — Progress Notes (Signed)
 This patient returns to the office for evaluation and treatment of long thick painful nails .  This patient is unable to trim her own nails since the patient cannot reach her feet.  Patient says the nails are painful walking and wearing her shoes.  She  returns for preventive foot care services. She presents to the office with her son.  General Appearance  Alert, conversant and in no acute stress.  Vascular  Dorsalis pedis and posterior tibial  pulses are weakly  palpable  bilaterally.  Capillary return is within normal limits  bilaterally. Cold feet. Bilaterally.  Purplish foot discoloration.  Absent digital hair  B/L.  Neurologic  Senn-Weinstein monofilament wire test within normal limits  bilaterally. Muscle power within normal limits bilaterally.  Nails Thick disfigured discolored nails with subungual debris  Hallux nails  bilaterally. No evidence of bacterial infection or drainage bilaterally.    Orthopedic  No limitations of motion  feet .  No crepitus or effusions noted.  No bony pathology or digital deformities noted.  Skin  normotropic skin with no porokeratosis noted bilaterally.  No signs of infections or ulcers noted.     Onychomycosis  Pain in toes right foot  Pain in toes left foot  Debridement  of nails  1-5  B/L with a nail nipper.  Nails were then filed using a dremel tool with no incidents.    RTC  4   months   Helane Gunther DPM

## 2024-07-05 DIAGNOSIS — I10 Essential (primary) hypertension: Secondary | ICD-10-CM | POA: Diagnosis not present

## 2024-07-05 DIAGNOSIS — Z8673 Personal history of transient ischemic attack (TIA), and cerebral infarction without residual deficits: Secondary | ICD-10-CM | POA: Diagnosis not present

## 2024-07-28 DIAGNOSIS — H401133 Primary open-angle glaucoma, bilateral, severe stage: Secondary | ICD-10-CM | POA: Diagnosis not present

## 2024-10-24 ENCOUNTER — Encounter: Payer: Self-pay | Admitting: Podiatry

## 2024-10-24 ENCOUNTER — Ambulatory Visit: Admitting: Podiatry

## 2024-10-24 DIAGNOSIS — M79674 Pain in right toe(s): Secondary | ICD-10-CM | POA: Diagnosis not present

## 2024-10-24 DIAGNOSIS — B351 Tinea unguium: Secondary | ICD-10-CM

## 2024-10-24 DIAGNOSIS — M79675 Pain in left toe(s): Secondary | ICD-10-CM

## 2024-10-24 NOTE — Progress Notes (Signed)
 This patient returns to the office for evaluation and treatment of long thick painful nails .  This patient is unable to trim her own nails since the patient cannot reach her feet.  Patient says the nails are painful walking and wearing her shoes.  She  returns for preventive foot care services. She presents to the office with her son.  General Appearance  Alert, conversant and in no acute stress.  Vascular  Dorsalis pedis and posterior tibial  pulses are weakly  palpable  bilaterally.  Capillary return is within normal limits  bilaterally. Cold feet. Bilaterally.  Purplish foot discoloration.  Absent digital hair  B/L.  Neurologic  Senn-Weinstein monofilament wire test within normal limits  bilaterally. Muscle power within normal limits bilaterally.  Nails Thick disfigured discolored nails with subungual debris  Hallux nails  bilaterally. No evidence of bacterial infection or drainage bilaterally.    Orthopedic  No limitations of motion  feet .  No crepitus or effusions noted.  No bony pathology or digital deformities noted.  Skin  normotropic skin with no porokeratosis noted bilaterally.  No signs of infections or ulcers noted.     Onychomycosis  Pain in toes right foot  Pain in toes left foot  Debridement  of nails  1-5  B/L with a nail nipper.  Nails were then filed using a dremel tool with no incidents.    RTC  4   months   Cordella Bold [facility] weldon

## 2025-02-21 ENCOUNTER — Ambulatory Visit: Admitting: Podiatry
